# Patient Record
Sex: Male | Born: 1935 | Race: Black or African American | Hispanic: No | Marital: Married | Smoking: Never smoker
Health system: Southern US, Community
[De-identification: ages and names within clinical notes are randomized; demographics above are authoritative.]

## PROBLEM LIST (undated history)

## (undated) DIAGNOSIS — Z9889 Other specified postprocedural states: Secondary | ICD-10-CM

## (undated) DIAGNOSIS — E785 Hyperlipidemia, unspecified: Secondary | ICD-10-CM

## (undated) DIAGNOSIS — H409 Unspecified glaucoma: Secondary | ICD-10-CM

## (undated) DIAGNOSIS — N400 Enlarged prostate without lower urinary tract symptoms: Secondary | ICD-10-CM

## (undated) DIAGNOSIS — I10 Essential (primary) hypertension: Secondary | ICD-10-CM

## (undated) DIAGNOSIS — M1612 Unilateral primary osteoarthritis, left hip: Secondary | ICD-10-CM

## (undated) DIAGNOSIS — M2540 Effusion, unspecified joint: Secondary | ICD-10-CM

## (undated) DIAGNOSIS — I639 Cerebral infarction, unspecified: Secondary | ICD-10-CM

## (undated) DIAGNOSIS — M1990 Unspecified osteoarthritis, unspecified site: Secondary | ICD-10-CM

## (undated) DIAGNOSIS — M1711 Unilateral primary osteoarthritis, right knee: Secondary | ICD-10-CM

## (undated) DIAGNOSIS — R350 Frequency of micturition: Secondary | ICD-10-CM

## (undated) DIAGNOSIS — K219 Gastro-esophageal reflux disease without esophagitis: Secondary | ICD-10-CM

## (undated) DIAGNOSIS — E119 Type 2 diabetes mellitus without complications: Secondary | ICD-10-CM

## (undated) HISTORY — PX: COLONOSCOPY: SHX174

## (undated) HISTORY — PX: SHOULDER OPEN ROTATOR CUFF REPAIR: SHX2407

## (undated) HISTORY — DX: Benign prostatic hyperplasia without lower urinary tract symptoms: N40.0

## (undated) HISTORY — DX: Essential (primary) hypertension: I10

## (undated) HISTORY — PX: PATELLAR TENDON REPAIR: SHX737

## (undated) HISTORY — DX: Gastro-esophageal reflux disease without esophagitis: K21.9

## (undated) HISTORY — PX: JOINT REPLACEMENT: SHX530

## (undated) HISTORY — DX: Hyperlipidemia, unspecified: E78.5

---
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
## 2006-07-18 ENCOUNTER — Emergency Department (HOSPITAL_COMMUNITY): Admission: EM | Admit: 2006-07-18 | Discharge: 2006-07-18 | Payer: Self-pay | Admitting: Emergency Medicine

## 2007-10-22 ENCOUNTER — Emergency Department (HOSPITAL_COMMUNITY): Admission: EM | Admit: 2007-10-22 | Discharge: 2007-10-22 | Payer: Self-pay | Admitting: Emergency Medicine

## 2009-11-11 HISTORY — PX: CARDIAC CATHETERIZATION: SHX172

## 2009-12-19 ENCOUNTER — Emergency Department (HOSPITAL_COMMUNITY): Admission: EM | Admit: 2009-12-19 | Discharge: 2009-12-19 | Payer: Self-pay | Admitting: Emergency Medicine

## 2010-09-14 ENCOUNTER — Ambulatory Visit: Payer: Self-pay | Admitting: Cardiology

## 2010-09-14 ENCOUNTER — Observation Stay (HOSPITAL_COMMUNITY): Admission: EM | Admit: 2010-09-14 | Discharge: 2010-09-15 | Payer: Self-pay | Admitting: Emergency Medicine

## 2010-09-14 ENCOUNTER — Encounter: Payer: Self-pay | Admitting: Cardiology

## 2010-09-15 ENCOUNTER — Encounter: Payer: Self-pay | Admitting: Internal Medicine

## 2010-09-24 ENCOUNTER — Ambulatory Visit: Payer: Self-pay | Admitting: Internal Medicine

## 2010-09-24 DIAGNOSIS — I10 Essential (primary) hypertension: Secondary | ICD-10-CM | POA: Insufficient documentation

## 2010-09-24 DIAGNOSIS — N401 Enlarged prostate with lower urinary tract symptoms: Secondary | ICD-10-CM

## 2010-09-24 DIAGNOSIS — K219 Gastro-esophageal reflux disease without esophagitis: Secondary | ICD-10-CM | POA: Insufficient documentation

## 2010-09-24 DIAGNOSIS — N138 Other obstructive and reflux uropathy: Secondary | ICD-10-CM | POA: Insufficient documentation

## 2010-10-09 ENCOUNTER — Encounter (INDEPENDENT_AMBULATORY_CARE_PROVIDER_SITE_OTHER): Payer: Self-pay | Admitting: *Deleted

## 2010-10-10 ENCOUNTER — Encounter (INDEPENDENT_AMBULATORY_CARE_PROVIDER_SITE_OTHER): Payer: Self-pay | Admitting: *Deleted

## 2010-10-15 ENCOUNTER — Ambulatory Visit: Payer: Self-pay | Admitting: Gastroenterology

## 2010-11-19 ENCOUNTER — Encounter: Payer: Self-pay | Admitting: Gastroenterology

## 2010-11-19 ENCOUNTER — Ambulatory Visit
Admission: RE | Admit: 2010-11-19 | Discharge: 2010-11-19 | Payer: Self-pay | Source: Home / Self Care | Attending: Gastroenterology | Admitting: Gastroenterology

## 2010-11-23 ENCOUNTER — Encounter: Payer: Self-pay | Admitting: Gastroenterology

## 2010-11-26 ENCOUNTER — Ambulatory Visit
Admission: RE | Admit: 2010-11-26 | Discharge: 2010-11-26 | Payer: Self-pay | Source: Home / Self Care | Attending: Internal Medicine | Admitting: Internal Medicine

## 2010-11-26 DIAGNOSIS — R972 Elevated prostate specific antigen [PSA]: Secondary | ICD-10-CM | POA: Insufficient documentation

## 2010-12-03 ENCOUNTER — Encounter: Payer: Self-pay | Admitting: Internal Medicine

## 2010-12-11 NOTE — Letter (Signed)
Summary: Results Follow-up Letter  [facility]  [address] [place], [place] [address]   Phone: [phone]  Fax: [phone]    09/24/2010  [address] [place], [place]  [address]  Dear Mr. WEIBLE,   The following are the results of your recent test(s):  Test     Result     Prostate test   slightly elevated   _________________________________________________________  Please call for an appointment soon _________________________________________________________ _________________________________________________________ _________________________________________________________  Sincerely,  Sanda Linger MD [facility]

## 2010-12-11 NOTE — Miscellaneous (Signed)
Summary: previsit prep/rm  Clinical Lists Changes  Medications: Added new medication of MOVIPREP 100 GM  SOLR (PEG-KCL-NACL-NASULF-NA ASC-C) As per prep instructions. - Signed Rx of MOVIPREP 100 GM  SOLR (PEG-KCL-NACL-NASULF-NA ASC-C) As per prep instructions.;  #1 x 0;  Signed;  Entered by: Sherren Kerns RN;  Authorized by: Mardella Layman MD [facility];  Method used: Electronically to [facility]. [place]. [phone]*, [address], [place], [place], [place]  [address], Ph: [phone] or [phone], Fax: [phone] Observations: Added new observation of ALLERGY REV: Done (10/15/2010 14:01)    Prescriptions: MOVIPREP 100 GM  SOLR (PEG-KCL-NACL-NASULF-NA ASC-C) As per prep instructions.  #1 x 0   Entered by:   Sherren Kerns RN   Authorized by:   Mardella Layman MD [facility]   Signed by:   Sherren Kerns RN on 10/15/2010   Method used:   Electronically to        [facility]. [address] [phone]* (retail)       [address]       [place], [place]  [address]       Ph: [phone] or [phone]       Fax: [phone]   RxID:   [id number]

## 2010-12-11 NOTE — Consult Note (Signed)
Summary: [facility]   [facility]   Imported By: Roderic Ovens 09/24/2010 09:24:41  _____________________________________________________________________  External Attachment:    Type:   Image     Comment:   External Document

## 2010-12-11 NOTE — Letter (Signed)
Summary: Medication Discharge Instructions/[place]  Medication Discharge Instructions/[place]   Imported By: Sherian Rein 09/27/2010 11:33:35  _____________________________________________________________________  External Attachment:    Type:   Image     Comment:   External Document

## 2010-12-11 NOTE — Assessment & Plan Note (Signed)
Summary: New / Medicare / # / cd   Vital Signs:  Patient profile:   75 year old male Height:      65 inches Weight:      163 pounds BMI:     27.22 O2 Sat:      97 % on Room air Temp:     98.5 degrees F oral Pulse rate:   58 / minute Pulse rhythm:   regular Resp:     16 per minute BP sitting:   136 / 72  (left arm) Cuff size:   large  Vitals Entered By: Rock Nephew CMA (September 24, 2010 1:18 PM)  Nutrition Counseling: Patient's BMI is greater than 25 and therefore counseled on weight management options.  O2 Flow:  Room air CC: New to establish, Preventive Care, Hypertension Management, Is Patient Diabetic? No Pain Assessment Patient in pain? no        Primary Care Provider:  Etta Grandchild MD  CC:  New to establish, Preventive Care, Hypertension Management, and .  History of Present Illness: Here for Medicare AWV:  1.   Risk factors based on Past M, S, F history: yes 2.   Physical Activities: very active 3.   Depression/mood: mood is good 4.   Hearing: hears whispered voice at 3 feet 5.   ADL's: thorough and independent 6.   Fall Risk: none noted 7.   Home Safety: home is safe 8.   Height, weight, &visual acuity: done 9.   Counseling: yes 10.   Labs ordered based on risk factors: yes 11.           Referral Coordination: yes, done 12.           Care Plan: completed 13.            Cognitive Assessment : he answers all questions appropriately   New to me he needs a PCP.  He was admitted about one week ago for chest pain and an abn EKG but his cardiac cath was normal. He describes heartburn and belching so I think his pain is from GERD.  He has a hx. of BPH and says that he has a prostate biopsy in [place] 3 years ago.  Dyspepsia History:      He has no alarm features of dyspepsia including no history of melena, hematochezia, dysphagia, persistent vomiting, or involuntary weight loss > 5%.  There is a prior history of GERD.  The patient does not have a prior  history of documented ulcer disease.  The dominant symptom is heartburn or acid reflux.  An H-2 blocker medication is currently being taken.  He notes that the symptoms have improved with the H-2 blocker therapy.  Symptoms have not persisted after 4 weeks of H-2 blocker treatment.  No previous upper endoscopy has been done.    Hypertension History:      He denies headache, chest pain, palpitations, dyspnea with exertion, orthopnea, PND, peripheral edema, visual symptoms, neurologic problems, syncope, and side effects from treatment.  He notes no problems with any antihypertensive medication side effects.        Positive major cardiovascular risk factors include male age 8 years old or older and hypertension.  Negative major cardiovascular risk factors include no history of diabetes or hyperlipidemia, negative family history for ischemic heart disease, and non-tobacco-user status.        Further assessment for target organ damage reveals no history of ASHD, cardiac end-organ damage (CHF/LVH), stroke/TIA, peripheral vascular disease,  renal insufficiency, or hypertensive retinopathy.      Preventive Screening-Counseling & Management  Alcohol-Tobacco     Alcohol drinks/day: 0     Alcohol Counseling: not indicated; patient does not drink     Smoking Status: never     Tobacco Counseling: not indicated; no tobacco use  Caffeine-Diet-Exercise     Does Patient Exercise: yes  Hep-HIV-STD-Contraception     Hepatitis Risk: no risk noted     HIV Risk: no risk noted     STD Risk: no risk noted     Dental Visit-last 6 months yes     Dental Care Counseling: to seek dental care; no dental care within six months     TSE monthly: yes     Testicular SE Education/Counseling to perform regular STE  Safety-Violence-Falls     Seat Belt Use: yes     Helmet Use: yes     Firearms in the Home: no firearms in the home     Smoke Detectors: yes     Violence in the Home: no risk noted      Sexual History:   currently monogamous.        Drug Use:  no.        Blood Transfusions:  no.    Clinical Review Panels:  Immunizations   Last Tetanus Booster:  Tdap (09/24/2010)   Last Flu Vaccine:  given (09/11/2010)  Diabetes Management   Last Flu Vaccine:  given (09/11/2010)   Current Medications (verified): 1)  Metoprolol Tartrate 25 Mg Tabs (Metoprolol Tartrate) .... Take 1 Tablet By Mouth Two Times A Day 2)  Aspirin 81mg  .... Take 1 Tablet By Mouth Once A Day 3)  Fish Oil 1000mg  .... Take 1 Tablet By Mouth Once A Day 4)  Protonix 40 Mg Tbec (Pantoprazole Sodium) .... Take 1 Tablet By Mouth Every Morning 5)  Glucosamine Hcl .... Take 1 Tablet By Mouth Once A Day 6)  Multivitamins .... Take 1 Tablet By Mouth Once A Day 7)  Tylenol 325 .Marland Kitchen.. 1-2 Every 6hrs As Needed For Pain 8)  Xalatan 0.005 % Soln (Latanoprost) .... One Drop in Both Eyes At Bedtime  Allergies (verified): No Known Drug Allergies  Past History:  Past Medical History: GERD Hypertension BPH  Past Surgical History: Rotator cuff repair  Family History: Family History Kidney disease  Social History: Retired Married Never Smoked Alcohol use-no Drug use-no Regular exercise-yes Smoking Status:  never Hepatitis Risk:  no risk noted HIV Risk:  no risk noted STD Risk:  no risk noted Dental Care w/in 6 mos.:  yes Seat Belt Use:  yes Sexual History:  currently monogamous Blood Transfusions:  no Drug Use:  no Does Patient Exercise:  yes  Review of Systems       The patient complains of severe indigestion/heartburn.  The patient denies anorexia, fever, weight loss, weight gain, dyspnea on exertion, peripheral edema, prolonged cough, headaches, hemoptysis, abdominal pain, melena, hematochezia, hematuria, suspicious skin lesions, difficulty walking, depression, abnormal bleeding, enlarged lymph nodes, and testicular masses.   GU:  Denies discharge, dysuria, erectile dysfunction, hematuria, incontinence, nocturia,  urinary frequency, and urinary hesitancy.  Physical Exam  General:  alert, well-developed, well-nourished, well-hydrated, appropriate dress, normal appearance, healthy-appearing, and cooperative to examination.   Head:  normocephalic, atraumatic, no abnormalities observed, and no abnormalities palpated.   Eyes:  vision grossly intact, pupils equal, pupils round, and pupils reactive to light.   Mouth:  Oral mucosa and oropharynx without lesions or exudates.  Teeth in good repair. Neck:  supple, full ROM, no masses, no thyromegaly, no thyroid nodules or tenderness, no JVD, normal carotid upstroke, no carotid bruits, and no cervical lymphadenopathy.   Chest Wall:  No deformities, masses, tenderness or gynecomastia noted. Breasts:  No masses or gynecomastia noted Lungs:  normal respiratory effort, no intercostal retractions, no accessory muscle use, normal breath sounds, no dullness, no fremitus, no crackles, and no wheezes.   Heart:  normal rate, regular rhythm, no murmur, no gallop, no rub, and no JVD.   Abdomen:  soft, non-tender, normal bowel sounds, no distention, no masses, no guarding, no rigidity, no rebound tenderness, no abdominal hernia, no inguinal hernia, no hepatomegaly, and no splenomegaly.   Rectal:  No external abnormalities noted. Normal sphincter tone. No rectal masses or tenderness. Genitalia:  uncircumcised, no hydrocele, no varicocele, no scrotal masses, no testicular masses or atrophy, no cutaneous lesions, and no urethral discharge.   Prostate:  no nodules, no asymmetry, no induration, and 3+ enlarged.   Msk:  normal ROM, no joint tenderness, no joint swelling, no joint warmth, no redness over joints, no joint deformities, no joint instability, no crepitation, and no muscle atrophy.   Pulses:  R and L carotid,radial,femoral,dorsalis pedis and posterior tibial pulses are full and equal bilaterally Extremities:  No clubbing, cyanosis, edema, or deformity noted with normal full  range of motion of all joints.   Neurologic:  No cranial nerve deficits noted. Station and gait are normal. Plantar reflexes are down-going bilaterally. DTRs are symmetrical throughout. Sensory, motor and coordinative functions appear intact. Skin:  turgor normal, color normal, no rashes, no suspicious lesions, no ecchymoses, no petechiae, no purpura, no ulcerations, and no edema.   Cervical Nodes:  no anterior cervical adenopathy and no posterior cervical adenopathy.   Axillary Nodes:  no R axillary adenopathy and no L axillary adenopathy.   Inguinal Nodes:  no R inguinal adenopathy and no L inguinal adenopathy.   Psych:  Cognition and judgment appear intact. Alert and cooperative with normal attention span and concentration. No apparent delusions, illusions, hallucinations   Impression & Recommendations:  Problem # 1:  HYPERTENSION (ICD-401.9) Assessment Improved  His updated medication list for this problem includes:    Metoprolol Tartrate 25 Mg Tabs (Metoprolol tartrate) .Marland Kitchen... Take 1 tablet by mouth two times a day  BP today: 136/72  10 Yr Risk Heart Disease: Not enough information  Problem # 2:  HYPERTROPHY PROSTATE W/UR OBST & OTH LUTS (ICD-600.01) Assessment: New  Orders: Venipuncture (60454) Prostate / PSA (Medicare) (G0103) DRE (G0102)  Problem # 3:  ROUTINE GENERAL MEDICAL EXAM@HEALTH  CARE FACL (ICD-V70.0) Assessment: New  Orders: Venipuncture (09811) TLB-PSA (Prostate Specific Antigen) (84153-PSA) Prostate / PSA (Medicare) (B1478) DRE (G0102) Hemoccult Guaiac-1 spec.(in office) (29562) Gastroenterology Referral (GI) Medicare -1st Annual Wellness Visit [phone])  Td Booster: Tdap (09/24/2010)   Flu Vax: given (09/11/2010)    Discussed using sunscreen, use of alcohol, drug use, self testicular exam, routine dental care, routine eye care, routine physical exam, seat belts, multiple vitamins, osteoporosis prevention, adequate calcium intake in diet, and recommendations  for immunizations.  Discussed exercise and checking cholesterol.  Discussed gun safety, safe sex, and contraception. Also recommend checking PSA.  Complete Medication List: 1)  Metoprolol Tartrate 25 Mg Tabs (Metoprolol tartrate) .... Take 1 tablet by mouth two times a day 2)  Aspirin 81mg   .... Take 1 tablet by mouth once a day 3)  Fish Oil 1000mg   .... Take 1 tablet by mouth  once a day 4)  Protonix 40 Mg Tbec (Pantoprazole sodium) .... Take 1 tablet by mouth every morning 5)  Glucosamine Hcl  .... Take 1 tablet by mouth once a day 6)  Multivitamins  .... Take 1 tablet by mouth once a day 7)  Tylenol 325  .Marland Kitchen.. 1-2 every 6hrs as needed for pain 8)  Xalatan 0.005 % Soln (Latanoprost) .... One drop in both eyes at bedtime  Other Orders: Tdap => 23yrs IM ([address]) Admin 1st Vaccine (60454)  Hypertension Assessment/Plan:      The patient's hypertensive risk group is category B: At least one risk factor (excluding diabetes) with no target organ damage.  Today's blood pressure is 136/72.  His blood pressure goal is < 140/90.  Colorectal Screening:  Current Recommendations:    Hemoccult: NEG X 1 today  PSA Screening:    Reviewed PSA screening recommendations: PSA ordered  Immunization & Chemoprophylaxis:    Tetanus vaccine: Tdap  (09/24/2010)    Influenza vaccine: given  (09/11/2010)  Patient Instructions: 1)  Please schedule a follow-up appointment in 2 months. 2)  Schedule a colonoscopy/sigmoidoscopy to help detect colon cancer. 3)  Check your Blood Pressure regularly. If it is above 140/90: you should make an appointment.   Orders Added: 1)  Venipuncture [36415] 2)  Tdap => 71yrs IM [90715] 3)  Admin 1st Vaccine [90471] 4)  Venipuncture [09811] 5)  TLB-PSA (Prostate Specific Antigen) [91478-GNF] 6)  Prostate / PSA (Medicare) [G0103] 7)  DRE [G0102] 8)  Hemoccult Guaiac-1 spec.(in office) [82270] 9)  Gastroenterology Referral [GI] 10)  Medicare -1st Annual Wellness Visit  [G0438] 11)  New Patient Level III [62130]   Immunizations Administered:  Tetanus Vaccine:    Vaccine Type: Tdap    Site: right deltoid    Mfr: GlaxoSmithKline    Dose: 0.5 ml    Route: IM    Given by: Rock Nephew CMA    Exp. Date: 08/30/2012    Lot #: [id number]    VIS given: 09/28/08 version given September 24, 2010.   Immunizations Administered:  Tetanus Vaccine:    Vaccine Type: Tdap    Site: right deltoid    Mfr: GlaxoSmithKline    Dose: 0.5 ml    Route: IM    Given by: Rock Nephew CMA    Exp. Date: 08/30/2012    Lot #: [id number]    VIS given: 09/28/08 version given September 24, 2010.  Preventive Care Screening  Last Flu Shot:    Date:  09/11/2010    Results:  given

## 2010-12-11 NOTE — Letter (Signed)
Summary: [facility] Instructions  [facility]  [address] [place], [place] [address]   Phone: [phone]  Fax: [phone]       Jonathan Daniels    Jun 20, 1936    MRN: [id number]        Procedure Day /Date:  Monday 11/19/2009     Arrival Time: 9:00 am      Procedure Time: 10:00 am     Location of Procedure:                    _ x_  [facility] (4th Floor)                         PREPARATION FOR COLONOSCOPY WITH MOVIPREP   Starting 5 days prior to your procedure Wednesday 1/4 do not eat nuts, seeds, popcorn, corn, beans, peas,  salads, or any raw vegetables.  Do not take any fiber supplements (e.g. Metamucil, Citrucel, and Benefiber).  THE DAY BEFORE YOUR PROCEDURE         DATE: Sunday 1/8  1.  Drink clear liquids the entire day-NO SOLID FOOD  2.  Do not drink anything colored red or purple.  Avoid juices with pulp.  No orange juice.  3.  Drink at least 64 oz. (8 glasses) of fluid/clear liquids during the day to prevent dehydration and help the prep work efficiently.  CLEAR LIQUIDS INCLUDE: Water Jello Ice Popsicles Tea (sugar ok, no milk/cream) Powdered fruit flavored drinks Coffee (sugar ok, no milk/cream) Gatorade Juice: apple, white grape, white cranberry  Lemonade Clear bullion, consomm, broth Carbonated beverages (any kind) Strained chicken noodle soup Hard Candy                             4.  In the morning, mix first dose of MoviPrep solution:    Empty 1 Pouch A and 1 Pouch B into the disposable container    Add lukewarm drinking water to the top line of the container. Mix to dissolve    Refrigerate (mixed solution should be used within 24 hrs)  5.  Begin drinking the prep at 5:00 p.m. The MoviPrep container is divided by 4 marks.   Every 15 minutes drink the solution down to the next mark (approximately 8 oz) until the full liter is complete.   6.  Follow completed prep with 16 oz of clear liquid of your choice (Nothing  red or purple).  Continue to drink clear liquids until bedtime.  7.  Before going to bed, mix second dose of MoviPrep solution:    Empty 1 Pouch A and 1 Pouch B into the disposable container    Add lukewarm drinking water to the top line of the container. Mix to dissolve    Refrigerate  THE DAY OF YOUR PROCEDURE      DATE: Monday 1/9  Beginning at 5:00 a.m. (5 hours before procedure):         1. Every 15 minutes, drink the solution down to the next mark (approx 8 oz) until the full liter is complete.  2. Follow completed prep with 16 oz. of clear liquid of your choice.    3. You may drink clear liquids until 8:00 am (2 HOURS BEFORE PROCEDURE).   MEDICATION INSTRUCTIONS  Unless otherwise instructed, you should take regular prescription medications with a small sip of water   as early as possible the morning  of your procedure.           OTHER INSTRUCTIONS  You will need a responsible adult at least 73 years of age to accompany you and drive you home.   This person must remain in the waiting room during your procedure.  Wear loose fitting clothing that is easily removed.  Leave jewelry and other valuables at home.  However, you may wish to bring a book to read or  an iPod/MP3 player to listen to music as you wait for your procedure to start.  Remove all body piercing jewelry and leave at home.  Total time from sign-in until discharge is approximately 2-3 hours.  You should go home directly after your procedure and rest.  You can resume normal activities the  day after your procedure.  The day of your procedure you should not:   Drive   Make legal decisions   Operate machinery   Drink alcohol   Return to work  You will receive specific instructions about eating, activities and medications before you leave.    The above instructions have been reviewed and explained to me by  Sherren Kerns RN  October 15, 2010 2:25 PM     I fully understand and can  verbalize these instructions _____________________________ Date _________

## 2010-12-11 NOTE — Letter (Signed)
Summary: Pre Visit Letter Revised  [facility]  [address] [place], [place] [address]   Phone: [phone]  Fax: [phone]        10/09/2010 MRN: [id number] Jonathan Daniels [address] [place], [place]  [address]             Procedure Date:  11/19/2010  Welcome to the Gastroenterology Division at [facility].    You are scheduled to see a nurse for your pre-procedure visit on 10/11/2010 at 8:00AM on the 3rd floor at [facility], [address]  We ask that you try to arrive at our office 15 minutes prior to your appointment time to allow for check-in.  Please take a minute to review the attached form.  If you answer "Yes" to one or more of the questions on the first page, we ask that you call the person listed at your earliest opportunity.  If you answer "No" to all of the questions, please complete the rest of the form and bring it to your appointment.    Your nurse visit will consist of discussing your medical and surgical history, your immediate family medical history, and your medications.   If you are unable to list all of your medications on the form, please bring the medication bottles to your appointment and we will list them.  We will need to be aware of both prescribed and over the counter drugs.  We will need to know exact dosage information as well.    Please be prepared to read and sign documents such as consent forms, a financial agreement, and acknowledgement forms.  If necessary, and with your consent, a friend or relative is welcome to sit-in on the nurse visit with you.  Please bring your insurance card so that we may make a copy of it.  If your insurance requires a referral to see a specialist, please bring your referral form from your primary care physician.  No co-pay is required for this nurse visit.     If you cannot keep your appointment, please call [phone] to cancel or reschedule prior to your appointment date.   This allows [place] the opportunity to schedule an appointment for another patient in need of care.    Thank you for choosing [facility] for your medical needs.  We appreciate the opportunity to care for you.  Please visit [place] at our website  to learn more about our practice.  Sincerely, The Gastroenterology Division

## 2010-12-13 NOTE — Assessment & Plan Note (Signed)
Summary: 2 MOS F/U #/CD   Vital Signs:  Patient profile:   75 year old male Height:      65 inches Weight:      164.25 pounds BMI:     27.43 O2 Sat:      94 % on Room air Temp:     97.7 degrees F oral Pulse rate:   64 / minute Pulse rhythm:   regular Resp:     16 per minute BP sitting:   148 / 88  (left arm) Cuff size:   large  Vitals Entered By: Rock Nephew CMA (November 26, 2010 9:50 AM)  O2 Flow:  Room air CC: follow-up visit Is Patient Diabetic? No   Primary Care Provider:  Etta Grandchild MD  CC:  follow-up visit.  History of Present Illness:  Hypertension Follow-Up      This is a 75 year old man who presents for Hypertension follow-up.  The patient denies lightheadedness, urinary frequency, headaches, edema, impotence, rash, and fatigue.  The patient denies the following associated symptoms: chest pain, chest pressure, exercise intolerance, dyspnea, palpitations, syncope, leg edema, and pedal edema.  Compliance with medications (by patient report) has been poor.  The patient reports that dietary compliance has been poor.  The patient reports no exercise.    Preventive Screening-Counseling & Management  Alcohol-Tobacco     Alcohol drinks/day: 0     Alcohol Counseling: not indicated; patient does not drink     Smoking Status: never     Tobacco Counseling: not indicated; no tobacco use  Hep-HIV-STD-Contraception     Hepatitis Risk: no risk noted     HIV Risk: no risk noted     STD Risk: no risk noted     Dental Visit-last 6 months yes     Dental Care Counseling: to seek dental care; no dental care within six months     TSE monthly: yes     Testicular SE Education/Counseling to perform regular STE      Sexual History:  currently monogamous.        Drug Use:  no.        Blood Transfusions:  no.    Medications Prior to Update: 1)  Metoprolol Tartrate 25 Mg Tabs (Metoprolol Tartrate) .... Take 1 Tablet By Mouth Two Times A Day 2)  Aspirin 81mg  .... Take 1  Tablet By Mouth Once A Day 3)  Fish Oil 1000mg  .... Take 1 Tablet By Mouth Once A Day 4)  Protonix 40 Mg Tbec (Pantoprazole Sodium) .... Take 1 Tablet By Mouth Every Morning 5)  Glucosamine Hcl .... Take 1 Tablet By Mouth Once A Day 6)  Multivitamins .... Take 1 Tablet By Mouth Once A Day 7)  Tylenol 325 .Marland Kitchen.. 1-2 Every 6hrs As Needed For Pain 8)  Xalatan 0.005 % Soln (Latanoprost) .... One Drop in Both Eyes At Bedtime  Current Medications (verified): 1)  Metoprolol Tartrate 25 Mg Tabs (Metoprolol Tartrate) .... Take 1 Tablet By Mouth Two Times A Day 2)  Aspirin 81mg  .... Take 1 Tablet By Mouth Once A Day 3)  Fish Oil 1000mg  .... Take 1 Tablet By Mouth Once A Day 4)  Protonix 40 Mg Tbec (Pantoprazole Sodium) .... Take 1 Tablet By Mouth Every Morning 5)  Glucosamine Hcl .... Take 1 Tablet By Mouth Once A Day 6)  Multivitamins .... Take 1 Tablet By Mouth Once A Day 7)  Tylenol 325 .Marland Kitchen.. 1-2 Every 6hrs As Needed For Pain 8)  Xalatan 0.005 % Soln (Latanoprost) .... One Drop in Both Eyes At Bedtime 9)  Benicar Hct 20-12.5 Mg Tabs (Olmesartan Medoxomil-Hctz) .... One By Mouth Once Daily  Allergies (verified): No Known Drug Allergies  Past History:  Past Medical History: Last updated: 09/24/2010 GERD Hypertension BPH  Past Surgical History: Last updated: 09/24/2010 Rotator cuff repair  Family History: Last updated: 09/24/2010 Family History Kidney disease  Social History: Last updated: 09/24/2010 Retired Married Never Smoked Alcohol use-no Drug use-no Regular exercise-yes  Risk Factors: Alcohol Use: 0 (11/26/2010) Exercise: yes (09/24/2010)  Risk Factors: Smoking Status: never (11/26/2010)  Family History: Reviewed history from 09/24/2010 and no changes required. Family History Kidney disease  Social History: Reviewed history from 09/24/2010 and no changes required. Retired Married Never Smoked Alcohol use-no Drug use-no Regular exercise-yes  Review of  Systems  The patient denies anorexia, fever, chest pain, peripheral edema, prolonged cough, headaches, hemoptysis, abdominal pain, hematuria, suspicious skin lesions, abnormal bleeding, and testicular masses.   GU:  Denies discharge, dysuria, hematuria, incontinence, nocturia, urinary frequency, and urinary hesitancy.  Physical Exam  General:  alert, well-developed, well-nourished, well-hydrated, appropriate dress, normal appearance, healthy-appearing, and cooperative to examination.   Mouth:  Oral mucosa and oropharynx without lesions or exudates.  Teeth in good repair. Neck:  supple, full ROM, no masses, no thyromegaly, no thyroid nodules or tenderness, no JVD, normal carotid upstroke, no carotid bruits, and no cervical lymphadenopathy.   Lungs:  normal respiratory effort, no intercostal retractions, no accessory muscle use, normal breath sounds, no dullness, no fremitus, no crackles, and no wheezes.   Heart:  normal rate, regular rhythm, no murmur, no gallop, no rub, and no JVD.   Abdomen:  soft, non-tender, normal bowel sounds, no distention, no masses, no guarding, no rigidity, no rebound tenderness, no abdominal hernia, no inguinal hernia, no hepatomegaly, and no splenomegaly.   Msk:  normal ROM, no joint tenderness, no joint swelling, no joint warmth, no redness over joints, no joint deformities, no joint instability, no crepitation, and no muscle atrophy.   Extremities:  No clubbing, cyanosis, edema, or deformity noted with normal full range of motion of all joints.   Neurologic:  No cranial nerve deficits noted. Station and gait are normal. Plantar reflexes are down-going bilaterally. DTRs are symmetrical throughout. Sensory, motor and coordinative functions appear intact. Skin:  turgor normal, color normal, no rashes, no suspicious lesions, no ecchymoses, no petechiae, no purpura, no ulcerations, and no edema.   Cervical Nodes:  no anterior cervical adenopathy and no posterior cervical  adenopathy.   Psych:  Cognition and judgment appear intact. Alert and cooperative with normal attention span and concentration. No apparent delusions, illusions, hallucinations   Impression & Recommendations:  Problem # 1:  PSA, INCREASED (ICD-790.93) Assessment New  Orders: Urology Referral (Urology)  Problem # 2:  HYPERTENSION (ICD-401.9) Assessment: Deteriorated  His updated medication list for this problem includes:    Metoprolol Tartrate 25 Mg Tabs (Metoprolol tartrate) .Marland Kitchen... Take 1 tablet by mouth two times a day    Benicar Hct 20-12.5 Mg Tabs (Olmesartan medoxomil-hctz) ..... One by mouth once daily  BP today: 148/88 Prior BP: 136/72 (09/24/2010)  Prior 10 Yr Risk Heart Disease: Not enough information (09/24/2010)  Problem # 3:  HYPERTROPHY PROSTATE W/UR OBST & OTH LUTS (ICD-600.01) Assessment: Unchanged  Complete Medication List: 1)  Metoprolol Tartrate 25 Mg Tabs (Metoprolol tartrate) .... Take 1 tablet by mouth two times a day 2)  Aspirin 81mg   .... Take 1 tablet by  mouth once a day 3)  Fish Oil 1000mg   .... Take 1 tablet by mouth once a day 4)  Protonix 40 Mg Tbec (Pantoprazole sodium) .... Take 1 tablet by mouth every morning 5)  Glucosamine Hcl  .... Take 1 tablet by mouth once a day 6)  Multivitamins  .... Take 1 tablet by mouth once a day 7)  Tylenol 325  .Marland Kitchen.. 1-2 every 6hrs as needed for pain 8)  Xalatan 0.005 % Soln (Latanoprost) .... One drop in both eyes at bedtime 9)  Benicar Hct 20-12.5 Mg Tabs (Olmesartan medoxomil-hctz) .... One by mouth once daily  Patient Instructions: 1)  Please schedule a follow-up appointment in 1 month. 2)  Check your Blood Pressure regularly. If it is above 130/80: you should make an appointment. Prescriptions: BENICAR HCT 20-12.5 MG TABS (OLMESARTAN MEDOXOMIL-HCTZ) One by mouth once daily  #28 x 0   Entered and Authorized by:   Etta Grandchild MD   Signed by:   Etta Grandchild MD on 11/26/2010   Method used:   Samples  Given   RxID:   [id number] METOPROLOL TARTRATE 25 MG TABS (METOPROLOL TARTRATE) Take 1 tablet by mouth two times a day  #60 x 11   Entered and Authorized by:   Etta Grandchild MD   Signed by:   Etta Grandchild MD on 11/26/2010   Method used:   Electronically to        [facility] [address] [phone]* (retail)       [address]       [place], [place]  [address]       Ph: [phone] or [phone]       Fax: [phone]   RxID:   [id number]    Orders Added: 1)  Urology Referral [Urology] 2)  Est. Patient Level IV [75643]

## 2010-12-13 NOTE — Procedures (Signed)
Summary: Colonoscopy  Patient: Jonathan Daniels Note: All result statuses are Final unless otherwise noted.  Tests: (1) Colonoscopy (COL)   COL Colonoscopy           DONE     [facility]     [address]     [place], [place]  [address]           COLONOSCOPY PROCEDURE REPORT           PATIENT:  Jonathan Daniels, Jonathan Daniels  MR#:  [id number]     BIRTHDATE:  08/17/1936, 75 yrs. old  GENDER:  male     ENDOSCOPIST:  Vania Rea. Jarold Motto, MD, [facility]     REF. BY:  Etta Grandchild, M.D.     PROCEDURE DATE:  11/19/2010     PROCEDURE:  Colonoscopy with biopsy     ASA CLASS:  Class II     INDICATIONS:  Routine Risk Screening     MEDICATIONS:   Fentanyl 50 mcg IV, Versed 5 mg IV           DESCRIPTION OF PROCEDURE:   After the risks benefits and     alternatives of the procedure were thoroughly explained, informed     consent was obtained.  Digital rectal exam was performed and     revealed no abnormalities.   The LB [id number] endoscope     was introduced through the anus and advanced to the cecum, which     was identified by both the appendix and ileocecal valve, without     limitations.  The quality of the prep was good, using MoviPrep.     The instrument was then slowly withdrawn as the colon was fully     examined.     <<PROCEDUREIMAGES>>           FINDINGS:  A diminutive polyp was found in the sigmoid colon.     FLAT POLYP COLD SNARE EXCISED.  This was otherwise a normal     examination of the colon.   Retroflexed views in the rectum     revealed no abnormalities.    The scope was then withdrawn from     the patient and the procedure completed.           COMPLICATIONS:  None     ENDOSCOPIC IMPRESSION:     1) Diminutive polyp in the sigmoid colon     2) Otherwise normal examination     R/O ADENOMA.     RECOMMENDATIONS:     1) Await biopsy results     2) Repeat colonoscopy in 5 years if polyp adenomatous; otherwise     10 years     REPEAT EXAM:  No        ______________________________     Vania Rea. Jarold Motto, MD, [facility]           CC:           n.     eSIGNED:   Vania Rea. Tramon Crescenzo at 11/19/2010 10:32 AM           Irving Shows, [id number]  Note: An exclamation mark (!) indicates a result that was not dispersed into the flowsheet. Document Creation Date: 11/19/2010 10:32 AM _______________________________________________________________________  (1) Order result status: Final Collection or observation date-time: 11/19/2010 10:26 Requested date-time:  Receipt date-time:  Reported date-time:  Referring Physician:   Ordering Physician: Sheryn Bison [phone]) Specimen Source:  Source: Launa Grill Order Number: [id number] Lab site:   Appended  Document: Colonoscopy     Procedures Next Due Date:    Colonoscopy: 11/2020

## 2010-12-13 NOTE — Letter (Signed)
Summary: Patient Notice- Polyp Results  [facility]  [address] [place], [place] [address]   Phone: [phone]  Fax: [phone]        November 23, 2010 MRN: [id number]    Jonathan Daniels [address] [place], [place]  [address]    Dear Mr. PROM,  I am pleased to inform you that the colon polyp(s) removed during your recent colonoscopy was (were) found to be benign (no cancer detected) upon pathologic examination.  I recommend you have a repeat colonoscopy examination in 10_ years to look for recurrent polyps, as having colon polyps increases your risk for having recurrent polyps or even colon cancer in the future.  Should you develop new or worsening symptoms of abdominal pain, bowel habit changes or bleeding from the rectum or bowels, please schedule an evaluation with either your primary care physician or with me.  Additional information/recommendations:  _xx_ No further action with gastroenterology is needed at this time. Please      follow-up with your primary care physician for your other healthcare      needs.  __ Please call [phone] to schedule a return visit to review your      situation.  __ Please keep your follow-up visit as already scheduled.  __ Continue treatment plan as outlined the day of your exam.  Please call us if you are having persistent problems or have questions about your condition that have not been fully answered at this time.  Sincerely,  Mardella Layman MD [facility]  This letter has been electronically signed by your physician.  Appended Document: Patient Notice- Polyp Results Letter mailed

## 2010-12-26 ENCOUNTER — Ambulatory Visit: Payer: Self-pay | Admitting: Internal Medicine

## 2010-12-27 NOTE — Consult Note (Signed)
Summary: Urology/[facility]  Urology/[facility]   Imported By: Lester Hawley 12/21/2010 16:01:58  _____________________________________________________________________  External Attachment:    Type:   Image     Comment:   External Document

## 2011-01-02 ENCOUNTER — Encounter: Payer: Self-pay | Admitting: Internal Medicine

## 2011-01-02 ENCOUNTER — Ambulatory Visit (INDEPENDENT_AMBULATORY_CARE_PROVIDER_SITE_OTHER): Payer: Medicare Other | Admitting: Internal Medicine

## 2011-01-02 DIAGNOSIS — R972 Elevated prostate specific antigen [PSA]: Secondary | ICD-10-CM

## 2011-01-02 DIAGNOSIS — I10 Essential (primary) hypertension: Secondary | ICD-10-CM

## 2011-01-02 DIAGNOSIS — K219 Gastro-esophageal reflux disease without esophagitis: Secondary | ICD-10-CM

## 2011-01-08 NOTE — Assessment & Plan Note (Signed)
Summary: 1 MO FU  / STC /NWS   Vital Signs:  Patient profile:   75 year old male Height:      65 inches Weight:      163.25 pounds BMI:     27.26 O2 Sat:      96 % on Room air Temp:     98.0 degrees F oral Pulse rate:   66 / minute Pulse rhythm:   regular Resp:     16 per minute BP sitting:   120 / 78  (left arm) Cuff size:   large  Vitals Entered By: Rock Nephew CMA (January 02, 2011 9:46 AM)  Nutrition Counseling: Patient's BMI is greater than 25 and therefore counseled on weight management options.  O2 Flow:  Room air CC: follow-up visit, Is Patient Diabetic? No Pain Assessment Patient in pain? no        Primary Care Provider:  Etta Grandchild MD  CC:  follow-up visit and .  History of Present Illness:  Hypertension Follow-Up      This is a 75 year old man who presents for Hypertension follow-up.  The patient denies lightheadedness, urinary frequency, headaches, edema, impotence, rash, and fatigue.  The patient denies the following associated symptoms: chest pain, chest pressure, exercise intolerance, dyspnea, palpitations, syncope, leg edema, and pedal edema.  Compliance with medications (by patient report) has been poor.  The patient reports that dietary compliance has been good.  The patient reports no exercise.  Adjunctive measures currently used by the patient include salt restriction and relaxation.    Dyspepsia History:      He has no alarm features of dyspepsia including no history of melena, hematochezia, dysphagia, persistent vomiting, or involuntary weight loss > 5%.  There is a prior history of GERD.  The patient does not have a prior history of documented ulcer disease.  The dominant symptom is heartburn or acid reflux.  An H-2 blocker medication is currently being taken.  He notes that the symptoms have improved with the H-2 blocker therapy.  Symptoms have not persisted after 4 weeks of H-2 blocker treatment.    Preventive Screening-Counseling &  Management  Alcohol-Tobacco     Alcohol drinks/day: 0     Alcohol Counseling: not indicated; patient does not drink     Smoking Status: never     Tobacco Counseling: not indicated; no tobacco use  Hep-HIV-STD-Contraception     Hepatitis Risk: no risk noted     HIV Risk: no risk noted     STD Risk: no risk noted     Dental Visit-last 6 months yes     Dental Care Counseling: to seek dental care; no dental care within six months     TSE monthly: yes     Testicular SE Education/Counseling to perform regular STE      Sexual History:  currently monogamous.        Drug Use:  no.        Blood Transfusions:  no.    Clinical Review Panels:  Prevention   Last Colonoscopy:  DONE (11/19/2010)   Last PSA:  6.13 (09/24/2010)  Immunizations   Last Tetanus Booster:  Tdap (09/24/2010)   Last Flu Vaccine:  given (09/11/2010)  Diabetes Management   Last Flu Vaccine:  given (09/11/2010)   Medications Prior to Update: 1)  Metoprolol Tartrate 25 Mg Tabs (Metoprolol Tartrate) .... Take 1 Tablet By Mouth Two Times A Day 2)  Aspirin 81mg  .... Take 1 Tablet By  Mouth Once A Day 3)  Fish Oil 1000mg  .... Take 1 Tablet By Mouth Once A Day 4)  Protonix 40 Mg Tbec (Pantoprazole Sodium) .... Take 1 Tablet By Mouth Every Morning 5)  Glucosamine Hcl .... Take 1 Tablet By Mouth Once A Day 6)  Multivitamins .... Take 1 Tablet By Mouth Once A Day 7)  Tylenol 325 .Marland Kitchen.. 1-2 Every 6hrs As Needed For Pain 8)  Xalatan 0.005 % Soln (Latanoprost) .... One Drop in Both Eyes At Bedtime 9)  Benicar Hct 20-12.5 Mg Tabs (Olmesartan Medoxomil-Hctz) .... One By Mouth Once Daily  Current Medications (verified): 1)  Metoprolol Tartrate 25 Mg Tabs (Metoprolol Tartrate) .... Take 1 Tablet By Mouth Two Times A Day 2)  Aspirin 81mg  .... Take 1 Tablet By Mouth Once A Day 3)  Fish Oil 1000mg  .... Take 1 Tablet By Mouth Once A Day 4)  Protonix 40 Mg Tbec (Pantoprazole Sodium) .... Take 1 Tablet By Mouth Every Morning 5)   Glucosamine Hcl .... Take 1 Tablet By Mouth Once A Day 6)  Multivitamins .... Take 1 Tablet By Mouth Once A Day 7)  Tylenol 325 .Marland Kitchen.. 1-2 Every 6hrs As Needed For Pain 8)  Xalatan 0.005 % Soln (Latanoprost) .... One Drop in Both Eyes At Bedtime  Allergies (verified): No Known Drug Allergies  Past History:  Past Medical History: Last updated: 09/24/2010 GERD Hypertension BPH  Past Surgical History: Last updated: 09/24/2010 Rotator cuff repair  Family History: Last updated: 09/24/2010 Family History Kidney disease  Social History: Last updated: 09/24/2010 Retired Married Never Smoked Alcohol use-no Drug use-no Regular exercise-yes  Risk Factors: Alcohol Use: 0 (01/02/2011) Exercise: yes (09/24/2010)  Risk Factors: Smoking Status: never (01/02/2011)  Family History: Reviewed history from 09/24/2010 and no changes required. Family History Kidney disease  Social History: Reviewed history from 09/24/2010 and no changes required. Retired Married Never Smoked Alcohol use-no Drug use-no Regular exercise-yes  Review of Systems       The patient complains of weight gain.  The patient denies anorexia, fever, weight loss, chest pain, syncope, dyspnea on exertion, peripheral edema, prolonged cough, headaches, hemoptysis, abdominal pain, melena, hematuria, suspicious skin lesions, difficulty walking, depression, enlarged lymph nodes, and angioedema.    Physical Exam  General:  alert, well-developed, well-nourished, well-hydrated, appropriate dress, normal appearance, healthy-appearing, and cooperative to examination.   Head:  normocephalic, atraumatic, no abnormalities observed, and no abnormalities palpated.   Eyes:  vision grossly intact, pupils equal, pupils round, and pupils reactive to light.   Mouth:  Oral mucosa and oropharynx without lesions or exudates.  Teeth in good repair. Neck:  supple, full ROM, no masses, no thyromegaly, no thyroid nodules or tenderness,  no JVD, normal carotid upstroke, no carotid bruits, and no cervical lymphadenopathy.   Lungs:  normal respiratory effort, no intercostal retractions, no accessory muscle use, normal breath sounds, no dullness, no fremitus, no crackles, and no wheezes.   Heart:  normal rate, regular rhythm, no murmur, no gallop, no rub, and no JVD.   Abdomen:  soft, non-tender, normal bowel sounds, no distention, no masses, no guarding, no rigidity, no rebound tenderness, no abdominal hernia, no inguinal hernia, no hepatomegaly, and no splenomegaly.   Msk:  normal ROM, no joint tenderness, no joint swelling, no joint warmth, no redness over joints, no joint deformities, no joint instability, no crepitation, and no muscle atrophy.   Pulses:  R and L carotid,radial,femoral,dorsalis pedis and posterior tibial pulses are full and equal bilaterally Extremities:  No  clubbing, cyanosis, edema, or deformity noted with normal full range of motion of all joints.   Neurologic:  No cranial nerve deficits noted. Station and gait are normal. Plantar reflexes are down-going bilaterally. DTRs are symmetrical throughout. Sensory, motor and coordinative functions appear intact. Skin:  turgor normal, color normal, no rashes, no suspicious lesions, no ecchymoses, no petechiae, no purpura, no ulcerations, and no edema.   Cervical Nodes:  no anterior cervical adenopathy and no posterior cervical adenopathy.   Axillary Nodes:  no R axillary adenopathy and no L axillary adenopathy.   Psych:  Cognition and judgment appear intact. Alert and cooperative with normal attention span and concentration. No apparent delusions, illusions, hallucinations   Impression & Recommendations:  Problem # 1:  HYPERTENSION (ICD-401.9) Assessment Improved  The following medications were removed from the medication list:    Benicar Hct 20-12.5 Mg Tabs (Olmesartan medoxomil-hctz) ..... One by mouth once daily His updated medication list for this problem  includes:    Metoprolol Tartrate 25 Mg Tabs (Metoprolol tartrate) .Marland Kitchen... Take 1 tablet by mouth two times a day  Problem # 2:  PSA, INCREASED (ICD-790.93) Assessment: Unchanged he is being followed at [facility] for this  Problem # 3:  GERD (ICD-530.81) Assessment: Improved  His updated medication list for this problem includes:    Protonix 40 Mg Tbec (Pantoprazole sodium) .Marland Kitchen... Take 1 tablet by mouth every morning  Complete Medication List: 1)  Metoprolol Tartrate 25 Mg Tabs (Metoprolol tartrate) .... Take 1 tablet by mouth two times a day 2)  Aspirin 81mg   .... Take 1 tablet by mouth once a day 3)  Fish Oil 1000mg   .... Take 1 tablet by mouth once a day 4)  Protonix 40 Mg Tbec (Pantoprazole sodium) .... Take 1 tablet by mouth every morning 5)  Glucosamine Hcl  .... Take 1 tablet by mouth once a day 6)  Multivitamins  .... Take 1 tablet by mouth once a day 7)  Tylenol 325  .Marland Kitchen.. 1-2 every 6hrs as needed for pain 8)  Xalatan 0.005 % Soln (Latanoprost) .... One drop in both eyes at bedtime   Patient Instructions: 1)  Please schedule a follow-up appointment in 4 months. 2)  It is important that you exercise regularly at least 20 minutes 5 times a week. If you develop chest pain, have severe difficulty breathing, or feel very tired , stop exercising immediately and seek medical attention. 3)  Check your Blood Pressure regularly. If it is above 140/90: you should make an appointment.   Orders Added: 1)  Est. Patient Level III [[address]]

## 2011-01-22 LAB — CBC
HCT: 40.7 % (ref 39.0–52.0)
Hemoglobin: 13.7 g/dL (ref 13.0–17.0)
MCV: 83.1 fL (ref 78.0–100.0)
RBC: 4.9 MIL/uL (ref 4.22–5.81)
RDW: 13.8 % (ref 11.5–15.5)
WBC: 9.3 10*3/uL (ref 4.0–10.5)

## 2011-01-22 LAB — POCT CARDIAC MARKERS
CKMB, poc: 2.7 ng/mL (ref 1.0–8.0)
Myoglobin, poc: 136 ng/mL (ref 12–200)

## 2011-01-22 LAB — URINALYSIS, ROUTINE W REFLEX MICROSCOPIC
Glucose, UA: NEGATIVE mg/dL
Hgb urine dipstick: NEGATIVE
Specific Gravity, Urine: 1.01 (ref 1.005–1.030)
pH: 6 (ref 5.0–8.0)

## 2011-01-22 LAB — PROTIME-INR
INR: 1.03 (ref 0.00–1.49)
Prothrombin Time: 13.7 seconds (ref 11.6–15.2)

## 2011-01-22 LAB — BASIC METABOLIC PANEL
BUN: 12 mg/dL (ref 6–23)
Chloride: 107 mEq/L (ref 96–112)
GFR calc Af Amer: >60 mL/min (ref >60)
GFR calc non Af Amer: 60 mL/min — ABNORMAL LOW (ref >60)
Potassium: 3.9 mEq/L (ref 3.5–5.1)
Sodium: 137 mEq/L (ref 135–145)

## 2011-01-22 LAB — COMPREHENSIVE METABOLIC PANEL
ALT: 25 U/L (ref 0–53)
AST: 30 U/L (ref 0–37)
Calcium: 9.2 mg/dL (ref 8.4–10.5)
Creatinine, Ser: 1.15 mg/dL (ref 0.4–1.5)
GFR calc Af Amer: >60 mL/min (ref >60)
Sodium: 138 mEq/L (ref 135–145)
Total Protein: 7.1 g/dL (ref 6.0–8.3)

## 2011-01-22 LAB — LIPID PANEL
Cholesterol: 124 mg/dL (ref 0–200)
HDL: 28 mg/dL — ABNORMAL LOW (ref >39)

## 2011-01-22 LAB — CARDIAC PANEL(CRET KIN+CKTOT+MB+TROPI)
CK, MB: 4.5 ng/mL — ABNORMAL HIGH (ref 0.3–4.0)
Relative Index: 1.8 (ref 0.0–2.5)
Troponin I: 0.01 ng/mL (ref 0.00–0.06)

## 2011-01-22 LAB — CK TOTAL AND CKMB (NOT AT ARMC)
CK, MB: 5.3 ng/mL — ABNORMAL HIGH (ref 0.3–4.0)
CK, MB: 6.8 ng/mL (ref 0.3–4.0)
Total CK: 269 U/L — ABNORMAL HIGH (ref 7–232)
Total CK: 345 U/L — ABNORMAL HIGH (ref 7–232)

## 2011-01-22 LAB — TSH: TSH: 3.522 u[IU]/mL (ref 0.350–4.500)

## 2011-01-22 LAB — LIPASE, BLOOD: Lipase: 50 U/L (ref 11–59)

## 2011-07-22 ENCOUNTER — Other Ambulatory Visit: Payer: Self-pay | Admitting: Internal Medicine

## 2011-10-25 ENCOUNTER — Other Ambulatory Visit: Payer: Self-pay | Admitting: Internal Medicine

## 2011-10-28 ENCOUNTER — Other Ambulatory Visit: Payer: Self-pay | Admitting: Internal Medicine

## 2011-10-28 MED ORDER — METOPROLOL TARTRATE 25 MG PO TABS: 25.0000 mg | ORAL_TABLET | Freq: Two times a day (BID) | ORAL | Status: DC

## 2011-10-30 ENCOUNTER — Ambulatory Visit (INDEPENDENT_AMBULATORY_CARE_PROVIDER_SITE_OTHER): Payer: Medicare Other | Admitting: Internal Medicine

## 2011-10-30 ENCOUNTER — Encounter: Payer: Self-pay | Admitting: Internal Medicine

## 2011-10-30 ENCOUNTER — Other Ambulatory Visit (INDEPENDENT_AMBULATORY_CARE_PROVIDER_SITE_OTHER): Payer: Medicare Other

## 2011-10-30 VITALS — BP 128/72 | HR 54 | Temp 97.5°F | Resp 16 | Wt 158.2 lb

## 2011-10-30 DIAGNOSIS — N138 Other obstructive and reflux uropathy: Secondary | ICD-10-CM

## 2011-10-30 DIAGNOSIS — R7309 Other abnormal glucose: Secondary | ICD-10-CM

## 2011-10-30 DIAGNOSIS — N401 Enlarged prostate with lower urinary tract symptoms: Secondary | ICD-10-CM

## 2011-10-30 DIAGNOSIS — I10 Essential (primary) hypertension: Secondary | ICD-10-CM

## 2011-10-30 DIAGNOSIS — Z23 Encounter for immunization: Secondary | ICD-10-CM

## 2011-10-30 DIAGNOSIS — E7800 Pure hypercholesterolemia, unspecified: Secondary | ICD-10-CM

## 2011-10-30 DIAGNOSIS — H409 Unspecified glaucoma: Secondary | ICD-10-CM

## 2011-10-30 DIAGNOSIS — R739 Hyperglycemia, unspecified: Secondary | ICD-10-CM

## 2011-10-30 DIAGNOSIS — Z0000 Encounter for general adult medical examination without abnormal findings: Secondary | ICD-10-CM

## 2011-10-30 DIAGNOSIS — R972 Elevated prostate specific antigen [PSA]: Secondary | ICD-10-CM

## 2011-10-30 LAB — LIPID PANEL
HDL: 32 mg/dL — ABNORMAL LOW (ref >39.00)
LDL Cholesterol: 106 mg/dL — ABNORMAL HIGH (ref 0–99)
Total CHOL/HDL Ratio: 5
VLDL: 22.6 mg/dL (ref 0.0–40.0)

## 2011-10-30 LAB — CBC WITH DIFFERENTIAL/PLATELET
Basophils Absolute: 0.1 10*3/uL (ref 0.0–0.1)
Basophils Relative: 0.7 % (ref 0.0–3.0)
Eosinophils Absolute: 0.3 10*3/uL (ref 0.0–0.7)
HCT: 42.5 % (ref 39.0–52.0)
Hemoglobin: 14.4 g/dL (ref 13.0–17.0)
Lymphocytes Relative: 17 % (ref 12.0–46.0)
Lymphs Abs: 1.5 10*3/uL (ref 0.7–4.0)
MCHC: 33.9 g/dL (ref 30.0–36.0)
Monocytes Relative: 8.6 % (ref 3.0–12.0)
Neutro Abs: 6 10*3/uL (ref 1.4–7.7)
RBC: 4.85 Mil/uL (ref 4.22–5.81)
RDW: 14.4 % (ref 11.5–14.6)

## 2011-10-30 LAB — URINALYSIS, ROUTINE W REFLEX MICROSCOPIC
Bilirubin Urine: NEGATIVE
Hgb urine dipstick: NEGATIVE
Ketones, ur: NEGATIVE
Leukocytes, UA: NEGATIVE
Specific Gravity, Urine: 1.025 (ref 1.000–1.030)
Urine Glucose: NEGATIVE
Urobilinogen, UA: 0.2 (ref 0.0–1.0)

## 2011-10-30 LAB — TSH: TSH: 1.72 u[IU]/mL (ref 0.35–5.50)

## 2011-10-30 LAB — COMPREHENSIVE METABOLIC PANEL
ALT: 26 U/L (ref 0–53)
CO2: 27 mEq/L (ref 19–32)
Calcium: 9.5 mg/dL (ref 8.4–10.5)
Chloride: 107 mEq/L (ref 96–112)
Creatinine, Ser: 1.3 mg/dL (ref 0.4–1.5)
GFR: 67.6 mL/min (ref >60.00)
Glucose, Bld: 129 mg/dL — ABNORMAL HIGH (ref 70–99)
Sodium: 142 mEq/L (ref 135–145)
Total Bilirubin: 1 mg/dL (ref 0.3–1.2)
Total Protein: 7.4 g/dL (ref 6.0–8.3)

## 2011-10-30 LAB — PSA: PSA: 6.66 ng/mL — ABNORMAL HIGH (ref 0.10–4.00)

## 2011-10-30 MED ORDER — METOPROLOL TARTRATE 25 MG PO TABS: 25.0000 mg | ORAL_TABLET | Freq: Two times a day (BID) | ORAL | Status: DC

## 2011-10-30 NOTE — Assessment & Plan Note (Signed)
His BP is well controlled, I will check his lytes today 

## 2011-10-30 NOTE — Assessment & Plan Note (Signed)
i will check his a1c today

## 2011-10-30 NOTE — Assessment & Plan Note (Signed)
His exam is unchanged

## 2011-10-30 NOTE — Assessment & Plan Note (Signed)
I will check his FLP today 

## 2011-10-30 NOTE — Patient Instructions (Signed)

## 2011-10-30 NOTE — Assessment & Plan Note (Signed)
His exam is unchanged, I will recheck his PSA today

## 2011-10-30 NOTE — Assessment & Plan Note (Signed)
ophth referral 

## 2011-10-30 NOTE — Progress Notes (Signed)
Subjective:    Patient ID: Jonathan Daniels, male    DOB: Dec 17, 1935, 75 y.o.   MRN: [id number]  Hypertension This is a chronic problem. The current episode started more than 1 year ago. The problem has been gradually improving since onset. The problem is controlled. Pertinent negatives include no anxiety, blurred vision, chest pain, headaches, malaise/fatigue, neck pain, orthopnea, palpitations, peripheral edema, PND, shortness of breath or sweats. There are no associated agents to hypertension. Past treatments include beta blockers. The current treatment provides significant improvement. Compliance problems include exercise and diet.  There is no history of chronic renal disease.  Hyperlipidemia This is a chronic problem. The current episode started more than 1 year ago. The problem is controlled. Recent lipid tests were reviewed and are variable. He has no history of chronic renal disease, diabetes, hypothyroidism, liver disease, obesity or nephrotic syndrome. Factors aggravating his hyperlipidemia include no known factors. Pertinent negatives include no chest pain, focal sensory loss, focal weakness, leg pain, myalgias or shortness of breath. Treatments tried: fish oil. The current treatment provides moderate improvement of lipids. Compliance problems include adherence to exercise and adherence to diet.       Review of Systems  Constitutional: Negative for fever, chills, malaise/fatigue, diaphoresis, activity change, appetite change, fatigue and unexpected weight change.  HENT: Negative.  Negative for neck pain.   Eyes: Negative.  Negative for blurred vision.  Respiratory: Negative for apnea, cough, choking, chest tightness, shortness of breath, wheezing and stridor.   Cardiovascular: Negative for chest pain, palpitations, orthopnea, leg swelling and PND.  Gastrointestinal: Negative for nausea, vomiting, abdominal pain, diarrhea, constipation, blood in stool, abdominal distention and anal bleeding.    Genitourinary: Negative for dysuria, urgency, frequency, hematuria, flank pain, decreased urine volume, discharge, penile swelling, scrotal swelling, enuresis, difficulty urinating, genital sores, penile pain and testicular pain.  Musculoskeletal: Negative.  Negative for myalgias.  Skin: Negative for color change, pallor, rash and wound.  Neurological: Negative for dizziness, tremors, focal weakness, seizures, syncope, facial asymmetry, speech difficulty, weakness, light-headedness, numbness and headaches.  Hematological: Negative for adenopathy. Does not bruise/bleed easily.  Psychiatric/Behavioral: Negative.        Objective:   Physical Exam  Vitals reviewed. Constitutional: He is oriented to person, place, and time. He appears well-developed and well-nourished. No distress.  HENT:  Head: Normocephalic and atraumatic.  Mouth/Throat: Oropharynx is clear and moist. No oropharyngeal exudate.  Eyes: Conjunctivae are normal. Right eye exhibits no discharge. Left eye exhibits no discharge. No scleral icterus.  Neck: Normal range of motion. Neck supple. No JVD present. No tracheal deviation present. No thyromegaly present.  Cardiovascular: Normal rate, regular rhythm, normal heart sounds and intact distal pulses.  Exam reveals no gallop and no friction rub.   No murmur heard. Pulmonary/Chest: Effort normal and breath sounds normal. No stridor. No respiratory distress. He has no wheezes. He has no rales. He exhibits no tenderness.  Abdominal: Soft. Bowel sounds are normal. He exhibits no distension and no mass. There is no tenderness. There is no rebound and no guarding. Hernia confirmed negative in the right inguinal area and confirmed negative in the left inguinal area.  Genitourinary: Rectum normal, testes normal and penis normal. Rectal exam shows no external hemorrhoid, no internal hemorrhoid, no fissure, no mass, no tenderness and anal tone normal. Guaiac negative stool. Prostate is enlarged  (2+ symmetrical hypertrophy). Prostate is not tender. Right testis shows no mass, no swelling and no tenderness. Right testis is descended. Left testis shows no mass,  no swelling and no tenderness. Left testis is descended. Uncircumcised. No phimosis, paraphimosis, hypospadias, penile erythema or penile tenderness. No discharge found.  Musculoskeletal: Normal range of motion. He exhibits no edema and no tenderness.  Lymphadenopathy:    He has no cervical adenopathy.       Right: No inguinal adenopathy present.       Left: No inguinal adenopathy present.  Neurological: He is oriented to person, place, and time.  Skin: Skin is warm and dry. No rash noted. He is not diaphoretic. No erythema. No pallor.  Psychiatric: He has a normal mood and affect. His behavior is normal. Judgment and thought content normal.      Lab Results  Component Value Date   WBC 9.3 09/15/2010   HGB 13.7 09/15/2010   HCT 40.7 09/15/2010   PLT 168 09/15/2010   GLUCOSE 113* 09/15/2010   CHOL  Value: 124 (NOTE) ATP III Classification:      < 200        mg/dL        Desirable     960 - 239     mg/dL        Borderline High     >= 240        mg/dL        High  45/02/980   TRIG 122 09/14/2010   HDL 28* 09/14/2010   LDLCALC  Value: 72 (NOTE)  Total Cholesterol/HDL Ratio:CHD Risk                       Coronary Heart Disease Risk Table                                       Men       Women         1/2 Average Risk              3.4        3.3             Average Risk              5.0         4.4         2 X Average Risk              9.6        7.1         3 X Average Risk             23.4       11.0 Use the calculated Patient Ratio above and the CHD Risk table  to determine the patient's CHD Risk. ATP III Classification (LDL):      < 100         mg/dL         Optimal     191 - 129     mg/dL         Near or Above Optimal     130 - 159     mg/dL         Borderline High     160 - 189     mg/dL         High      > 478        mg/dL         Very  High  29/03/6212  ALT 25 09/13/2010   AST 30 09/13/2010   NA 137 09/15/2010   K 3.9 09/15/2010   CL 107 09/15/2010   CREATININE 1.20 09/15/2010   BUN 12 09/15/2010   CO2 25 09/15/2010   TSH 3.522 09/14/2010   PSA 6.13* 09/24/2010   INR 1.03 09/14/2010      Assessment & Plan:

## 2011-10-30 NOTE — Assessment & Plan Note (Signed)

## 2011-11-26 ENCOUNTER — Other Ambulatory Visit: Payer: Self-pay | Admitting: Internal Medicine

## 2011-11-29 ENCOUNTER — Ambulatory Visit: Payer: Medicare Other | Admitting: Internal Medicine

## 2011-12-04 ENCOUNTER — Ambulatory Visit (INDEPENDENT_AMBULATORY_CARE_PROVIDER_SITE_OTHER): Payer: Medicare Other | Admitting: Internal Medicine

## 2011-12-04 ENCOUNTER — Encounter: Payer: Self-pay | Admitting: Internal Medicine

## 2011-12-04 DIAGNOSIS — R972 Elevated prostate specific antigen [PSA]: Secondary | ICD-10-CM

## 2011-12-04 DIAGNOSIS — E119 Type 2 diabetes mellitus without complications: Secondary | ICD-10-CM | POA: Insufficient documentation

## 2011-12-04 DIAGNOSIS — N138 Other obstructive and reflux uropathy: Secondary | ICD-10-CM

## 2011-12-04 DIAGNOSIS — I10 Essential (primary) hypertension: Secondary | ICD-10-CM

## 2011-12-04 DIAGNOSIS — E7800 Pure hypercholesterolemia, unspecified: Secondary | ICD-10-CM

## 2011-12-04 DIAGNOSIS — N401 Enlarged prostate with lower urinary tract symptoms: Secondary | ICD-10-CM

## 2011-12-04 DIAGNOSIS — IMO0001 Reserved for inherently not codable concepts without codable children: Secondary | ICD-10-CM

## 2011-12-04 LAB — HM DIABETES FOOT EXAM: HM Diabetic Foot Exam: NORMAL

## 2011-12-04 LAB — HM DIABETES EYE EXAM

## 2011-12-04 MED ORDER — ATORVASTATIN CALCIUM 40 MG PO TABS: 40.0000 mg | ORAL_TABLET | Freq: Every day | ORAL | Status: DC

## 2011-12-04 MED ORDER — OLMESARTAN MEDOXOMIL 20 MG PO TABS: 20.0000 mg | ORAL_TABLET | Freq: Every day | ORAL | Status: DC

## 2011-12-04 MED ORDER — SAXAGLIPTIN HCL 5 MG PO TABS: 5.0000 mg | ORAL_TABLET | Freq: Every day | ORAL | Status: DC

## 2011-12-04 NOTE — Assessment & Plan Note (Signed)
His PSA has not had a significant increase since Nov 2011 so I don't think this is Ca but it is BPH

## 2011-12-04 NOTE — Patient Instructions (Signed)
Hypertension As your heart beats, it forces blood through your arteries. This force is your blood pressure. If the pressure is too high, it is called hypertension (HTN) or high blood pressure. HTN is dangerous because you may have it and not know it. High blood pressure may mean that your heart has to work harder to pump blood. Your arteries may be narrow or stiff. The extra work puts you at risk for heart disease, stroke, and other problems.  Blood pressure consists of two numbers, a higher number over a lower, 110/72, for example. It is stated as "110 over 72." The ideal is below 120 for the top number (systolic) and under 80 for the bottom (diastolic). Write down your blood pressure today. You should pay close attention to your blood pressure if you have certain conditions such as:  Heart failure.   Prior heart attack.   Diabetes   Chronic kidney disease.   Prior stroke.   Multiple risk factors for heart disease.  To see if you have HTN, your blood pressure should be measured while you are seated with your arm held at the level of the heart. It should be measured at least twice. A one-time elevated blood pressure reading (especially in the Emergency Department) does not mean that you need treatment. There may be conditions in which the blood pressure is different between your right and left arms. It is important to see your caregiver soon for a recheck. Most people have essential hypertension which means that there is not a specific cause. This type of high blood pressure may be lowered by changing lifestyle factors such as:  Stress.   Smoking.   Lack of exercise.   Excessive weight.   Drug/tobacco/alcohol use.   Eating less salt.  Most people do not have symptoms from high blood pressure until it has caused damage to the body. Effective treatment can often prevent, delay or reduce that damage. TREATMENT  When a cause has been identified, treatment for high blood pressure is  directed at the cause. There are a large number of medications to treat HTN. These fall into several categories, and your caregiver will help you select the medicines that are best for you. Medications may have side effects. You should review side effects with your caregiver. If your blood pressure stays high after you have made lifestyle changes or started on medicines,   Your medication(s) may need to be changed.   Other problems may need to be addressed.   Be certain you understand your prescriptions, and know how and when to take your medicine.   Be sure to follow up with your caregiver within the time frame advised (usually within two weeks) to have your blood pressure rechecked and to review your medications.   If you are taking more than one medicine to lower your blood pressure, make sure you know how and at what times they should be taken. Taking two medicines at the same time can result in blood pressure that is too low.  SEEK IMMEDIATE MEDICAL CARE IF:  You develop a severe headache, blurred or changing vision, or confusion.   You have unusual weakness or numbness, or a faint feeling.   You have severe chest or abdominal pain, vomiting, or breathing problems.  MAKE SURE YOU:   Understand these instructions.   Will watch your condition.   Will get help right away if you are not doing well or get worse.  Document Released: 10/28/2005 Document Revised: 07/10/2011 Document Reviewed:   06/17/2008 Exit[employer] Patient Information 2012 [employer].Diabetes, Type 2 Diabetes is a long-lasting (chronic) disease. In type 2 diabetes, the pancreas does not make enough insulin (a hormone), and the body does not respond normally to the insulin that is made. This type of diabetes was also previously called adult-onset diabetes. It usually occurs after the age of 40, but it can occur at any age.  CAUSES  Type 2 diabetes happens because the pancreasis not making enough insulin or your body has  trouble using the insulin that your pancreas does make properly. SYMPTOMS   Drinking more than usual.   Urinating more than usual.   Blurred vision.   Dry, itchy skin.   Frequent infections.   Feeling more tired than usual (fatigue).  DIAGNOSIS The diagnosis of type 2 diabetes is usually made by one of the following tests:  Fasting blood glucose test. You will not eat for at least 8 hours and then take a blood test.   Random blood glucose test. Your blood glucose (sugar) is checked at any time of the day regardless of when you ate.   Oral glucose tolerance test (OGTT). Your blood glucose is measured after you have not eaten (fasted) and then after you drink a glucose containing beverage.  TREATMENT   Healthy eating.   Exercise.   Medicine, if needed.   Monitoring blood glucose.   Seeing your caregiver regularly.  HOME CARE INSTRUCTIONS   Check your blood glucose at least once a day. More frequent monitoring may be necessary, depending on your medicines and on how well your diabetes is controlled. Your caregiver will advise you.   Take your medicine as directed by your caregiver.   Do not smoke.   Make wise food choices. Ask your caregiver for information. Weight loss can improve your diabetes.   Learn about low blood glucose (hypoglycemia) and how to treat it.   Get your eyes checked regularly.   Have a yearly physical exam. Have your blood pressure checked and your blood and urine tested.   Wear a pendant or bracelet saying that you have diabetes.   Check your feet every night for cuts, sores, blisters, and redness. Let your caregiver know if you have any problems.  SEEK MEDICAL CARE IF:   You have problems keeping your blood glucose in target range.   You have problems with your medicines.   You have symptoms of an illness that do not improve after 24 hours.   You have a sore or wound that is not healing.   You notice a change in vision or a new problem  with your vision.   You have a fever.  MAKE SURE YOU:  Understand these instructions.   Will watch your condition.   Will get help right away if you are not doing well or get worse.  Document Released: 10/28/2005 Document Revised: 07/11/2011 Document Reviewed: 04/15/2011 [employer] Patient Information 2012 [employer]. 

## 2011-12-04 NOTE — Assessment & Plan Note (Signed)
Start lipitor

## 2011-12-04 NOTE — Assessment & Plan Note (Signed)
His BP is not well controlled so I added benicar for BP control as well as renal protection, he agrees to continue taking metoprolol

## 2011-12-04 NOTE — Progress Notes (Signed)
Subjective:    Patient ID: Jonathan Daniels, male    DOB: [birth year], 76 y.o.   MRN: [id number]  Hypertension This is a chronic problem. The current episode started more than 1 year ago. The problem has been gradually worsening since onset. The problem is uncontrolled. Pertinent negatives include no anxiety, blurred vision, chest pain, headaches, malaise/fatigue, neck pain, orthopnea, palpitations, peripheral edema, PND, shortness of breath or sweats. Past treatments include beta blockers. The current treatment provides mild improvement. Compliance problems include exercise and diet.   Diabetes He presents for his initial diabetic visit. He has type 2 diabetes mellitus. His disease course has been stable. There are no hypoglycemic associated symptoms. Pertinent negatives for hypoglycemia include no dizziness, headaches, pallor, seizures, speech difficulty, sweats or tremors. Pertinent negatives for diabetes include no blurred vision, no chest pain, no fatigue, no foot paresthesias, no foot ulcerations, no polydipsia, no polyphagia, no polyuria, no visual change, no weakness and no weight loss. There are no hypoglycemic complications. Symptoms are stable. There are no diabetic complications. When asked about current treatments, none were reported. His weight is stable. He is following a generally healthy diet. Meal planning includes avoidance of concentrated sweets. He has not had a previous visit with a dietician. He participates in exercise intermittently. An ACE inhibitor/angiotensin II receptor blocker is not being taken. He does not see a podiatrist.Eye exam is not current.      Review of Systems  Constitutional: Negative for fever, chills, weight loss, malaise/fatigue, diaphoresis, activity change, appetite change, fatigue and unexpected weight change.  HENT: Negative.  Negative for neck pain.   Eyes: Negative.  Negative for blurred vision.  Respiratory: Negative for cough, chest tightness, shortness  of breath, wheezing and stridor.   Cardiovascular: Negative for chest pain, palpitations, orthopnea, leg swelling and PND.  Gastrointestinal: Negative for nausea, vomiting, abdominal pain, diarrhea, constipation, blood in stool and abdominal distention.  Genitourinary: Negative for dysuria, urgency, polyuria, frequency, hematuria, flank pain, decreased urine volume, discharge, penile swelling, scrotal swelling, enuresis, difficulty urinating, genital sores, penile pain and testicular pain.  Musculoskeletal: Negative for myalgias, back pain, joint swelling, arthralgias and gait problem.  Skin: Negative for color change, pallor, rash and wound.  Neurological: Negative for dizziness, tremors, seizures, syncope, facial asymmetry, speech difficulty, weakness, light-headedness, numbness and headaches.  Hematological: Negative for polydipsia, polyphagia and adenopathy. Does not bruise/bleed easily.  Psychiatric/Behavioral: Negative.        Objective:   Physical Exam  Vitals reviewed. Constitutional: He is oriented to person, place, and time. He appears well-developed and well-nourished. No distress.  HENT:  Head: Normocephalic and atraumatic.  Mouth/Throat: Oropharynx is clear and moist. No oropharyngeal exudate.  Eyes: Conjunctivae are normal. Right eye exhibits no discharge. Left eye exhibits no discharge. No scleral icterus.  Neck: Normal range of motion. Neck supple. No JVD present. No tracheal deviation present. No thyromegaly present.  Cardiovascular: Normal rate, regular rhythm, normal heart sounds and intact distal pulses.  Exam reveals no gallop and no friction rub.   No murmur heard. Pulmonary/Chest: Effort normal and breath sounds normal. No stridor. No respiratory distress. He has no wheezes. He has no rales. He exhibits no tenderness.  Abdominal: Soft. Bowel sounds are normal. He exhibits no distension and no mass. There is no tenderness. There is no rebound and no guarding.    Musculoskeletal: Normal range of motion. He exhibits no edema and no tenderness.  Lymphadenopathy:    He has no cervical adenopathy.  Neurological: He is oriented  to person, place, and time.  Skin: Skin is warm and dry. No rash noted. He is not diaphoretic. No erythema. No pallor.  Psychiatric: He has a normal mood and affect. His behavior is normal. Judgment and thought content normal.          Assessment & Plan:

## 2011-12-04 NOTE — Assessment & Plan Note (Signed)
Start onglyza and benicar, referred for eye exam and diabetic education

## 2012-02-03 ENCOUNTER — Encounter: Payer: Medicare Other | Attending: Internal Medicine | Admitting: *Deleted

## 2012-02-03 ENCOUNTER — Encounter: Payer: Self-pay | Admitting: *Deleted

## 2012-02-03 VITALS — Ht 65.5 in | Wt 155.4 lb

## 2012-02-03 DIAGNOSIS — IMO0001 Reserved for inherently not codable concepts without codable children: Secondary | ICD-10-CM

## 2012-02-03 DIAGNOSIS — Z713 Dietary counseling and surveillance: Secondary | ICD-10-CM | POA: Insufficient documentation

## 2012-02-03 DIAGNOSIS — E119 Type 2 diabetes mellitus without complications: Secondary | ICD-10-CM | POA: Insufficient documentation

## 2012-02-03 NOTE — Patient Instructions (Addendum)
Goals:  Follow Diabetes Meal Plan as instructed (see yellow card).  Limit sugar-sweetened beverages and concentrated sweets.  Eat 3 meals and 2 snacks, every 3-5 hrs - Avoid meal skipping.  Add lean protein foods to all meals/snacks.  Monitor glucose levels as instructed by your doctor.  Aim for 30+ mins of physical activity daily.

## 2012-02-03 NOTE — Progress Notes (Signed)
Medical Nutrition Therapy:  Appt start time: 1100 end time:  1200.  ASSESSMENT: T2DM, New Onset.  Pt here with spouse for DM education.  New diagnosis 2-3 months ago and recent A1c of 7.1% (10/30/11) noted. Pt not instructed by MD to check BG, though wants to know if he should be. Directed back to MD. Partial food recall reveals meal skipping several days/week and excessive CHO soda. Pt also consumes fried foods often. Pt reports he walks his dog daily, though does not get any additional exercise. No sx of hyperglycemia reported.    MEDICATIONS: See medication list; reconciled with pt   DIETARY INTAKE:  Usual eating pattern includes 2-3 meals and 0 snacks per day.  24-hr recall: Unable to obtain full recall d/t multiple questions from patient and inability to hear well.  B ( AM): Cereal w/ milk; Cheerwine  Snk ( AM): none  L ( PM): Cheerwine Snk ( PM): none D ( PM): Fried chicken wings (3 ea); Cheerwine Snk ( PM): none Beverages: Cheerwine, milk  Usual physical activity: Walks dog 2 miles a day  Estimated energy needs: 1500-1600 calories 170-180 g carbohydrates 110-120 g protein 40-50 g fat  Progress Towards Goal(s):  In progress.   Nutritional Diagnosis:  [address]-2.1 Impaired nutrient utilization related to glucose metabolism as evidenced by a recent A1c of 7.1% and new diagnosis of T2DM.    Intervention: Nutrition education (see pt instructions).  Handouts given during visit include:  Living Well with Diabetes - [employer] book  60g CHO meals  Snack List  Monitoring/Evaluation:  Dietary intake, exercise, A1c, BG trends, and body weight in 6-8 week(s).

## 2012-02-28 ENCOUNTER — Other Ambulatory Visit: Payer: Self-pay

## 2012-02-28 MED ORDER — SAXAGLIPTIN HCL 5 MG PO TABS: 5.0000 mg | ORAL_TABLET | Freq: Every day | ORAL | Status: DC

## 2012-04-13 ENCOUNTER — Encounter: Payer: Self-pay | Admitting: *Deleted

## 2012-04-13 ENCOUNTER — Encounter: Payer: Medicare Other | Attending: Internal Medicine | Admitting: *Deleted

## 2012-04-13 VITALS — Ht 65.5 in | Wt 149.7 lb

## 2012-04-13 DIAGNOSIS — E119 Type 2 diabetes mellitus without complications: Secondary | ICD-10-CM | POA: Insufficient documentation

## 2012-04-13 DIAGNOSIS — Z713 Dietary counseling and surveillance: Secondary | ICD-10-CM | POA: Insufficient documentation

## 2012-04-13 DIAGNOSIS — IMO0001 Reserved for inherently not codable concepts without codable children: Secondary | ICD-10-CM

## 2012-04-13 NOTE — Patient Instructions (Addendum)
Goals:  Follow Diabetes Meal Plan as instructed (see yellow card).  Limit sugar-sweetened beverages and concentrated sweets.  Choose 4 carb choices per meal and 1 choice per snack.  Add protein to all meals and snacks.  Eat 3 meals and 2 snacks, every 3-5 hrs - Avoid meal skipping.  Aim for 30+ mins of physical activity daily.  Follow up with doctor regarding referral for blood glucose monitoring.   Estimated energy needs: 1500-1600 calories 170-180 g carbohydrates 110-120 g protein 40-50 g fat <2000 mg sodium

## 2012-04-13 NOTE — Progress Notes (Addendum)
Medical Nutrition Therapy:  Appt start time: 0945   end time:  1025.  Primary Concerns Today: T2DM, F/U.  Pt returns with spouse for f/u. Last A1c of 7.1% on 10/29/12; no f/u with PCP reported. Has discontinued sugar sweetened soda/drinks, though continues to eat large portions of carbs at some meals (i.e. see breakfast below) and skipping others.  Decreased protein intake also noted.  No hypoglycemic episodes reported. His wife asked again about BGM and states she thinks he should be checking his blood sugar. Recommend referral to [facility] for BGM and recheck of A1c.   MEDICATIONS: See medication list; No changes reported.    DIETARY INTAKE:  Usual eating pattern includes 2 meals and 0-1 snacks per day.  24-hr recall:  B ( AM): 1-1.5 cup Raisin Bran w/ LF milk; Diet Dr. Reino Kent  (110 g carbs vs recommended 60 g) Snk ( AM): none  L ( PM): none Snk ( PM): 2 pcs raisin toast D ( PM): Cabbage soup ("whatever my wife makes")  Snk ( PM): none Beverages: Diet Dr. Reino Kent, milk  Usual physical activity: Walks dog 2 miles a day; lifts weights   Estimated energy needs: 1500-1600 calories 170-180 g carbohydrates 110-120 g protein 40-50 g fat  Progress Towards Goal(s):  In progress.   Nutritional Diagnosis:  [place]-2.1 Impaired nutrient utilization related to glucose metabolism as evidenced by a recent A1c of 7.1% and new diagnosis of T2DM.    Intervention: Nutrition education   Goals:  Follow Diabetes Meal Plan as instructed (see yellow card).  Limit sugar-sweetened beverages and concentrated sweets.  Choose 4 carb choices per meal and 1 choice per snack.  Add protein to all meals and snacks.  Eat 3 meals and 2 snacks, every 3-5 hrs - Avoid meal skipping.  Aim for 30+ mins of physical activity daily.  Follow up with doctor regarding referral for blood glucose monitoring.   Monitoring/Evaluation:  Dietary intake, exercise, A1c, BG trends, and body weight in 8 week(s).

## 2012-06-25 ENCOUNTER — Ambulatory Visit: Payer: Medicare Other | Admitting: *Deleted

## 2012-06-26 ENCOUNTER — Ambulatory Visit: Payer: Medicare Other | Admitting: *Deleted

## 2013-05-27 ENCOUNTER — Ambulatory Visit (INDEPENDENT_AMBULATORY_CARE_PROVIDER_SITE_OTHER): Payer: Medicare Other | Admitting: Internal Medicine

## 2013-05-27 ENCOUNTER — Encounter: Payer: Self-pay | Admitting: Internal Medicine

## 2013-05-27 ENCOUNTER — Ambulatory Visit (INDEPENDENT_AMBULATORY_CARE_PROVIDER_SITE_OTHER)
Admission: RE | Admit: 2013-05-27 | Discharge: 2013-05-27 | Disposition: A | Discharge status: Home / Self Care | Payer: Medicare Other | Source: Ambulatory Visit | Attending: Internal Medicine | Admitting: Internal Medicine

## 2013-05-27 ENCOUNTER — Other Ambulatory Visit (INDEPENDENT_AMBULATORY_CARE_PROVIDER_SITE_OTHER): Payer: Medicare Other

## 2013-05-27 VITALS — BP 150/86 | HR 72 | Temp 97.5°F | Resp 16 | Ht 65.5 in | Wt 150.0 lb

## 2013-05-27 DIAGNOSIS — M25552 Pain in left hip: Secondary | ICD-10-CM

## 2013-05-27 DIAGNOSIS — E7800 Pure hypercholesterolemia, unspecified: Secondary | ICD-10-CM

## 2013-05-27 DIAGNOSIS — R972 Elevated prostate specific antigen [PSA]: Secondary | ICD-10-CM

## 2013-05-27 DIAGNOSIS — M1610 Unilateral primary osteoarthritis, unspecified hip: Secondary | ICD-10-CM

## 2013-05-27 DIAGNOSIS — M1612 Unilateral primary osteoarthritis, left hip: Secondary | ICD-10-CM | POA: Insufficient documentation

## 2013-05-27 DIAGNOSIS — I10 Essential (primary) hypertension: Secondary | ICD-10-CM

## 2013-05-27 DIAGNOSIS — M25559 Pain in unspecified hip: Secondary | ICD-10-CM

## 2013-05-27 DIAGNOSIS — M169 Osteoarthritis of hip, unspecified: Secondary | ICD-10-CM

## 2013-05-27 DIAGNOSIS — Z23 Encounter for immunization: Secondary | ICD-10-CM

## 2013-05-27 DIAGNOSIS — IMO0001 Reserved for inherently not codable concepts without codable children: Secondary | ICD-10-CM

## 2013-05-27 DIAGNOSIS — Z0000 Encounter for general adult medical examination without abnormal findings: Secondary | ICD-10-CM

## 2013-05-27 DIAGNOSIS — F068 Other specified mental disorders due to known physiological condition: Secondary | ICD-10-CM

## 2013-05-27 DIAGNOSIS — G8929 Other chronic pain: Secondary | ICD-10-CM

## 2013-05-27 DIAGNOSIS — F0390 Unspecified dementia without behavioral disturbance: Secondary | ICD-10-CM | POA: Insufficient documentation

## 2013-05-27 HISTORY — DX: Unilateral primary osteoarthritis, left hip: M16.12

## 2013-05-27 LAB — LIPID PANEL
Cholesterol: 143 mg/dL (ref 0–200)
LDL Cholesterol: 86 mg/dL (ref 0–99)
Triglycerides: 133 mg/dL (ref 0.0–149.0)

## 2013-05-27 LAB — CBC WITH DIFFERENTIAL/PLATELET
Basophils Absolute: 0 10*3/uL (ref 0.0–0.1)
Eosinophils Absolute: 0.3 10*3/uL (ref 0.0–0.7)
Lymphocytes Relative: 18.5 % (ref 12.0–46.0)
MCHC: 33.5 g/dL (ref 30.0–36.0)
Neutrophils Relative %: 68.8 % (ref 43.0–77.0)
Platelets: 186 10*3/uL (ref 150.0–400.0)
RBC: 4.75 Mil/uL (ref 4.22–5.81)
RDW: 14.2 % (ref 11.5–14.6)

## 2013-05-27 LAB — COMPREHENSIVE METABOLIC PANEL
ALT: 19 U/L (ref 0–53)
CO2: 26 mEq/L (ref 19–32)
Calcium: 9.5 mg/dL (ref 8.4–10.5)
Chloride: 105 mEq/L (ref 96–112)
Creatinine, Ser: 1.2 mg/dL (ref 0.4–1.5)
GFR: 78.82 mL/min (ref >60.00)
Total Protein: 7.1 g/dL (ref 6.0–8.3)

## 2013-05-27 LAB — URINALYSIS, ROUTINE W REFLEX MICROSCOPIC
RBC / HPF: NONE SEEN (ref 0–?)
Specific Gravity, Urine: 1.02 (ref 1.000–1.030)
Urobilinogen, UA: 0.2 (ref 0.0–1.0)

## 2013-05-27 LAB — HM DIABETES FOOT EXAM

## 2013-05-27 LAB — MICROALBUMIN / CREATININE URINE RATIO: Microalb Creat Ratio: 0.3 mg/g (ref 0.0–30.0)

## 2013-05-27 MED ORDER — SAXAGLIPTIN HCL 5 MG PO TABS: 5.0000 mg | ORAL_TABLET | Freq: Every day | ORAL | Status: DC

## 2013-05-27 MED ORDER — GLUCOSE BLOOD VI STRP: ORAL_STRIP | Status: DC

## 2013-05-27 MED ORDER — ATORVASTATIN CALCIUM 20 MG PO TABS: 20.0000 mg | ORAL_TABLET | Freq: Every day | ORAL | Status: DC

## 2013-05-27 MED ORDER — OLMESARTAN MEDOXOMIL 40 MG PO TABS: 40.0000 mg | ORAL_TABLET | Freq: Every day | ORAL | Status: DC

## 2013-05-27 NOTE — Assessment & Plan Note (Signed)
I have asked him to restart onglyza I will recheck his A1C and renal function today He was referred for an eye exam

## 2013-05-27 NOTE — Assessment & Plan Note (Signed)
Start lipitor for risk reduction

## 2013-05-27 NOTE — Patient Instructions (Signed)
Degenerative Arthritis You have osteoarthritis. This is the wear and tear arthritis that comes with aging. It is also called degenerative arthritis. This is common in people past middle age. It is caused by stress on the joints. The large weight bearing joints of the lower extremities are most often affected. The knees, hips, back, neck, and hands can become painful, swollen, and stiff. This is the most common type of arthritis. It comes on with age, carrying too much weight, or from an injury. Treatment includes resting the sore joint until the pain and swelling improve. Crutches or a walker may be needed for severe flares. Only take over-the-counter or prescription medicines for pain, discomfort, or fever as directed by your caregiver. Local heat therapy may improve motion. Cortisone shots into the joint are sometimes used to reduce pain and swelling during flares. Osteoarthritis is usually not crippling and progresses slowly. There are things you can do to decrease pain:  Avoid high impact activities.  Exercise regularly.  Low impact exercises such as walking, biking and swimming help to keep the muscles strong and keep normal joint function.  Stretching helps to keep your range of motion.  Lose weight if you are overweight. This reduces joint stress. In severe cases when you have pain at rest or increasing disability, joint surgery may be helpful. See your caregiver for follow-up treatment as recommended.  SEEK IMMEDIATE MEDICAL CARE IF:   You have severe joint pain.  Marked swelling and redness in your joint develops.  You develop a high fever. Document Released: 10/28/2005 Document Revised: 01/20/2012 Document Reviewed: 03/30/2007 [employer] Patient Information 2014 [employer]. Health Maintenance, Males A healthy lifestyle and preventative care can promote health and wellness.  Maintain regular health, dental, and eye exams.  Eat a healthy diet. Foods like vegetables, fruits,  whole grains, low-fat dairy products, and lean protein foods contain the nutrients you need without too many calories. Decrease your intake of foods high in solid fats, added sugars, and salt. Get information about a proper diet from your caregiver, if necessary.  Regular physical exercise is one of the most important things you can do for your health. Most adults should get at least 150 minutes of moderate-intensity exercise (any activity that increases your heart rate and causes you to sweat) each week. In addition, most adults need muscle-strengthening exercises on 2 or more days a week.   Maintain a healthy weight. The body mass index (BMI) is a screening tool to identify possible weight problems. It provides an estimate of body fat based on height and weight. Your caregiver can help determine your BMI, and can help you achieve or maintain a healthy weight. For adults 20 years and older:  A BMI below 18.5 is considered underweight.  A BMI of 18.5 to 24.9 is normal.  A BMI of 25 to 29.9 is considered overweight.  A BMI of 30 and above is considered obese.  Maintain normal blood lipids and cholesterol by exercising and minimizing your intake of saturated fat. Eat a balanced diet with plenty of fruits and vegetables. Blood tests for lipids and cholesterol should begin at age 20 and be repeated every 5 years. If your lipid or cholesterol levels are high, you are over 50, or you are a high risk for heart disease, you may need your cholesterol levels checked more frequently.Ongoing high lipid and cholesterol levels should be treated with medicines, if diet and exercise are not effective.  If you smoke, find out from your caregiver how   to quit. If you do not use tobacco, do not start.  If you choose to drink alcohol, do not exceed 2 drinks per day. One drink is considered to be 12 ounces (355 mL) of beer, 5 ounces (148 mL) of wine, or 1.5 ounces (44 mL) of liquor.  Avoid use of street drugs. Do  not share needles with anyone. Ask for help if you need support or instructions about stopping the use of drugs.  High blood pressure causes heart disease and increases the risk of stroke. Blood pressure should be checked at least every 1 to 2 years. Ongoing high blood pressure should be treated with medicines if weight loss and exercise are not effective.  If you are 45 to 75 years old, ask your caregiver if you should take aspirin to prevent heart disease.  Diabetes screening involves taking a blood sample to check your fasting blood sugar level. This should be done once every 3 years, after age 45, if you are within normal weight and without risk factors for diabetes. Testing should be considered at a younger age or be carried out more frequently if you are overweight and have at least 1 risk factor for diabetes.  Colorectal cancer can be detected and often prevented. Most routine colorectal cancer screening begins at the age of 50 and continues through age 75. However, your caregiver may recommend screening at an earlier age if you have risk factors for colon cancer. On a yearly basis, your caregiver may provide home test kits to check for hidden blood in the stool. Use of a small camera at the end of a tube, to directly examine the colon (sigmoidoscopy or colonoscopy), can detect the earliest forms of colorectal cancer. Talk to your caregiver about this at age 50, when routine screening begins. Direct examination of the colon should be repeated every 5 to 10 years through age 75, unless early forms of pre-cancerous polyps or small growths are found.  Hepatitis C blood testing is recommended for all people born from 1945 through 1965 and any individual with known risks for hepatitis C.  Healthy men should no longer receive prostate-specific antigen (PSA) blood tests as part of routine cancer screening. Consult with your caregiver about prostate cancer screening.  Testicular cancer screening is not  recommended for adolescents or adult males who have no symptoms. Screening includes self-exam, caregiver exam, and other screening tests. Consult with your caregiver about any symptoms you have or any concerns you have about testicular cancer.  Practice safe sex. Use condoms and avoid high-risk sexual practices to reduce the spread of sexually transmitted infections (STIs).  Use sunscreen with a sun protection factor (SPF) of 30 or greater. Apply sunscreen liberally and repeatedly throughout the day. You should seek shade when your shadow is shorter than you. Protect yourself by wearing long sleeves, pants, a wide-brimmed hat, and sunglasses year round, whenever you are outdoors.  Notify your caregiver of new moles or changes in moles, especially if there is a change in shape or color. Also notify your caregiver if a mole is larger than the size of a pencil eraser.  A one-time screening for abdominal aortic aneurysm (AAA) and surgical repair of large AAAs by sound wave imaging (ultrasonography) is recommended for ages 65 to 75 years who are current or former smokers.  Stay current with your immunizations. Document Released: 04/25/2008 Document Revised: 01/20/2012 Document Reviewed: 03/25/2011 [employer] Patient Information 2014 [employer].  

## 2013-05-27 NOTE — Progress Notes (Signed)
Subjective:    Patient ID: Jonathan Daniels, male    DOB: 1935/12/01, 77 y.o.   MRN: [id number]  Hip Pain  Incident onset: for more than one year. There was no injury mechanism. The pain is present in the left hip. The quality of the pain is described as aching. The pain is at a severity of 3/10. The pain is mild. The pain has been worsening since onset. Pertinent negatives include no inability to bear weight, loss of motion, loss of sensation, muscle weakness, numbness or tingling. The symptoms are aggravated by movement and weight bearing. He has tried NSAIDs for the symptoms. The treatment provided no relief.      Review of Systems  Constitutional: Negative.  Negative for fever, chills, diaphoresis, activity change, appetite change, fatigue and unexpected weight change.  HENT: Negative.   Eyes: Negative.   Respiratory: Negative.  Negative for apnea, cough, choking, chest tightness, shortness of breath, wheezing and stridor.   Cardiovascular: Negative.  Negative for chest pain, palpitations and leg swelling.  Gastrointestinal: Negative.  Negative for nausea, vomiting, abdominal pain, diarrhea and constipation.  Endocrine: Negative.  Negative for polydipsia, polyphagia and polyuria.  Genitourinary: Negative.  Negative for dysuria, urgency, frequency, hematuria, flank pain and decreased urine volume.  Musculoskeletal: Negative.  Negative for myalgias, back pain, joint swelling, arthralgias and gait problem.  Skin: Negative.   Allergic/Immunologic: Negative.   Neurological: Negative for dizziness, tingling, weakness, light-headedness and numbness.  Hematological: Negative.  Negative for adenopathy. Does not bruise/bleed easily.  Psychiatric/Behavioral: Negative.        Objective:   Physical Exam  Vitals reviewed. Constitutional: He is oriented to person, place, and time. He appears well-developed and well-nourished. No distress.  HENT:  Head: Normocephalic and atraumatic.  Mouth/Throat:  Oropharynx is clear and moist. No oropharyngeal exudate.  Eyes: Conjunctivae are normal. Right eye exhibits no discharge. Left eye exhibits no discharge. No scleral icterus.  Neck: Normal range of motion. Neck supple. No JVD present. No tracheal deviation present. No thyromegaly present.  Cardiovascular: Normal rate, regular rhythm, normal heart sounds and intact distal pulses.  Exam reveals no gallop and no friction rub.   No murmur heard. Pulmonary/Chest: Effort normal and breath sounds normal. No stridor. No respiratory distress. He has no wheezes. He has no rales. He exhibits no tenderness.  Abdominal: Soft. Bowel sounds are normal. He exhibits no distension and no mass. There is no tenderness. There is no rebound and no guarding. Hernia confirmed negative in the right inguinal area and confirmed negative in the left inguinal area.  Genitourinary: Rectum normal, testes normal and penis normal. Rectal exam shows no external hemorrhoid, no internal hemorrhoid, no fissure, no mass and no tenderness. Guaiac negative stool. Prostate is enlarged (2+ smooth symm BPH, no nodules). Prostate is not tender. Right testis shows no mass, no swelling and no tenderness. Right testis is descended. Left testis shows no mass, no swelling and no tenderness. Left testis is descended. Uncircumcised. No phimosis, paraphimosis, hypospadias, penile erythema or penile tenderness. No discharge found.  Musculoskeletal: Normal range of motion. He exhibits no edema and no tenderness.       Left hip: He exhibits bony tenderness. He exhibits normal range of motion, normal strength, no tenderness, no swelling, no crepitus, no deformity and no laceration.  Lymphadenopathy:    He has no cervical adenopathy.       Right: No inguinal adenopathy present.       Left: No inguinal adenopathy present.  Neurological: He  is oriented to person, place, and time.  Skin: Skin is warm and dry. No rash noted. He is not diaphoretic. No erythema.  No pallor.  Psychiatric: He has a normal mood and affect. Thought content normal. His speech is delayed and tangential. His speech is not rapid and/or pressured and not slurred. He is slowed. Cognition and memory are normal. He is communicative. He is inattentive.     Lab Results  Component Value Date   WBC 8.5 10/30/2011   HGB 14.4 10/30/2011   HCT 42.5 10/30/2011   PLT 200.0 10/30/2011   GLUCOSE 129* 10/30/2011   CHOL 161 10/30/2011   TRIG 113.0 10/30/2011   HDL 32.00* 10/30/2011   LDLCALC 106* 10/30/2011   ALT 26 10/30/2011   AST 30 10/30/2011   NA 142 10/30/2011   K 4.5 10/30/2011   CL 107 10/30/2011   CREATININE 1.3 10/30/2011   BUN 21 10/30/2011   CO2 27 10/30/2011   TSH 1.72 10/30/2011   PSA 6.66* 10/30/2011   INR 1.03 09/14/2010   HGBA1C 7.1* 10/30/2011       Assessment & Plan:

## 2013-05-27 NOTE — Assessment & Plan Note (Signed)
Plain film is + for DJD -----> ortho referral

## 2013-05-27 NOTE — Assessment & Plan Note (Signed)
I will recheck his PSA today, if it is rising will refer to urology

## 2013-05-27 NOTE — Assessment & Plan Note (Signed)
He is not interested in treating this

## 2013-05-27 NOTE — Assessment & Plan Note (Addendum)

## 2013-05-27 NOTE — Addendum Note (Signed)
Addended by: Etta Grandchild on: 05/27/2013 12:47 PM   Modules accepted: Orders

## 2013-05-27 NOTE — Assessment & Plan Note (Signed)
Ortho referral  

## 2013-05-27 NOTE — Assessment & Plan Note (Signed)
Check labs today to look for end organ damage and secondary causes Start Benicar

## 2013-06-30 ENCOUNTER — Ambulatory Visit (INDEPENDENT_AMBULATORY_CARE_PROVIDER_SITE_OTHER): Payer: Medicare Other | Admitting: Internal Medicine

## 2013-06-30 ENCOUNTER — Encounter: Payer: Self-pay | Admitting: Internal Medicine

## 2013-06-30 VITALS — BP 138/86 | HR 70 | Temp 97.9°F | Resp 16 | Ht 65.5 in | Wt 151.1 lb

## 2013-06-30 DIAGNOSIS — IMO0001 Reserved for inherently not codable concepts without codable children: Secondary | ICD-10-CM

## 2013-06-30 DIAGNOSIS — I10 Essential (primary) hypertension: Secondary | ICD-10-CM

## 2013-06-30 DIAGNOSIS — M169 Osteoarthritis of hip, unspecified: Secondary | ICD-10-CM

## 2013-06-30 DIAGNOSIS — M1610 Unilateral primary osteoarthritis, unspecified hip: Secondary | ICD-10-CM

## 2013-06-30 DIAGNOSIS — R972 Elevated prostate specific antigen [PSA]: Secondary | ICD-10-CM

## 2013-06-30 NOTE — Assessment & Plan Note (Signed)
He saw ortho about this and was told that he needs hip replacement surgery but he has decided not to do that He has been taking an nsaid with some improvement

## 2013-06-30 NOTE — Progress Notes (Signed)
  Subjective:    Patient ID: Jonathan Daniels, male    DOB: 04-06-1936, 77 y.o.   MRN: [id number]  Hypertension This is a chronic problem. The current episode started more than 1 year ago. The problem is unchanged. The problem is controlled. Pertinent negatives include no anxiety, blurred vision, chest pain, headaches, malaise/fatigue, neck pain, orthopnea, palpitations, peripheral edema, PND, shortness of breath or sweats. Agents associated with hypertension include NSAIDs. Past treatments include angiotensin blockers. The current treatment provides moderate improvement. Compliance problems include diet and exercise.       Review of Systems  Constitutional: Negative.  Negative for malaise/fatigue.  HENT: Negative.  Negative for neck pain.   Eyes: Negative.  Negative for blurred vision.  Respiratory: Negative.  Negative for apnea, cough, choking, chest tightness, shortness of breath, wheezing and stridor.   Cardiovascular: Negative.  Negative for chest pain, palpitations, orthopnea, leg swelling and PND.  Gastrointestinal: Negative.  Negative for nausea, vomiting, abdominal pain, diarrhea and constipation.  Endocrine: Negative.  Negative for polydipsia, polyphagia and polyuria.  Genitourinary: Negative.   Musculoskeletal: Positive for arthralgias (left hip). Negative for myalgias, back pain, joint swelling and gait problem.  Skin: Negative.   Allergic/Immunologic: Negative.   Neurological: Negative.  Negative for dizziness, tremors, weakness, light-headedness, numbness and headaches.  Hematological: Negative.  Negative for adenopathy. Does not bruise/bleed easily.  Psychiatric/Behavioral: Negative.        Objective:   Physical Exam  Vitals reviewed. Constitutional: He is oriented to person, place, and time. He appears well-developed and well-nourished. No distress.  HENT:  Head: Normocephalic and atraumatic.  Mouth/Throat: Oropharynx is clear and moist. No oropharyngeal exudate.  Eyes:  Conjunctivae are normal. Right eye exhibits no discharge. Left eye exhibits no discharge. No scleral icterus.  Neck: Normal range of motion. Neck supple. No JVD present. No tracheal deviation present. No thyromegaly present.  Cardiovascular: Normal rate, regular rhythm, normal heart sounds and intact distal pulses.  Exam reveals no gallop and no friction rub.   No murmur heard. Pulmonary/Chest: Effort normal and breath sounds normal. No stridor. No respiratory distress. He has no wheezes. He has no rales. He exhibits no tenderness.  Abdominal: Soft. Bowel sounds are normal. He exhibits no distension and no mass. There is no tenderness. There is no rebound and no guarding.  Musculoskeletal: Normal range of motion. He exhibits no edema and no tenderness.  Lymphadenopathy:    He has no cervical adenopathy.  Neurological: He is oriented to person, place, and time.  Skin: Skin is warm and dry. No rash noted. He is not diaphoretic. No erythema. No pallor.     Lab Results  Component Value Date   WBC 8.1 05/27/2013   HGB 14.0 05/27/2013   HCT 41.6 05/27/2013   PLT 186.0 05/27/2013   GLUCOSE 125* 05/27/2013   CHOL 143 05/27/2013   TRIG 133.0 05/27/2013   HDL 30.20* 05/27/2013   LDLCALC 86 05/27/2013   ALT 19 05/27/2013   AST 24 05/27/2013   NA 139 05/27/2013   K 4.1 05/27/2013   CL 105 05/27/2013   CREATININE 1.2 05/27/2013   BUN 16 05/27/2013   CO2 26 05/27/2013   TSH 2.73 05/27/2013   PSA 8.75* 05/27/2013   INR 1.03 09/14/2010   HGBA1C 7.3* 05/27/2013   MICROALBUR 0.6 05/27/2013       Assessment & Plan:

## 2013-06-30 NOTE — Patient Instructions (Signed)

## 2013-06-30 NOTE — Assessment & Plan Note (Signed)
His A1C is acceptalbe

## 2013-06-30 NOTE — Assessment & Plan Note (Signed)
His BP is adequately well controlled 

## 2013-06-30 NOTE — Assessment & Plan Note (Signed)
He sees urology soon about this

## 2013-07-15 ENCOUNTER — Encounter (INDEPENDENT_AMBULATORY_CARE_PROVIDER_SITE_OTHER): Payer: Medicare Other | Admitting: Ophthalmology

## 2013-07-15 DIAGNOSIS — E11319 Type 2 diabetes mellitus with unspecified diabetic retinopathy without macular edema: Secondary | ICD-10-CM

## 2013-07-15 DIAGNOSIS — H35039 Hypertensive retinopathy, unspecified eye: Secondary | ICD-10-CM

## 2013-07-15 DIAGNOSIS — I10 Essential (primary) hypertension: Secondary | ICD-10-CM

## 2013-07-15 DIAGNOSIS — H43819 Vitreous degeneration, unspecified eye: Secondary | ICD-10-CM

## 2013-07-15 DIAGNOSIS — E1139 Type 2 diabetes mellitus with other diabetic ophthalmic complication: Secondary | ICD-10-CM

## 2013-07-19 ENCOUNTER — Encounter: Payer: Self-pay | Admitting: Internal Medicine

## 2014-01-20 ENCOUNTER — Other Ambulatory Visit: Payer: Self-pay | Admitting: Orthopedic Surgery

## 2014-01-28 ENCOUNTER — Encounter (HOSPITAL_COMMUNITY): Payer: Self-pay | Admitting: Pharmacy Technician

## 2014-02-01 ENCOUNTER — Encounter (HOSPITAL_COMMUNITY)
Admission: RE | Admit: 2014-02-01 | Discharge: 2014-02-01 | Disposition: A | Discharge status: Home / Self Care | Payer: MEDICARE | Source: Ambulatory Visit | Attending: Anesthesiology | Admitting: Anesthesiology

## 2014-02-01 ENCOUNTER — Encounter (HOSPITAL_COMMUNITY)
Admission: RE | Admit: 2014-02-01 | Discharge: 2014-02-01 | Disposition: A | Discharge status: Home / Self Care | Payer: MEDICARE | Source: Ambulatory Visit | Attending: Orthopedic Surgery | Admitting: Orthopedic Surgery

## 2014-02-01 ENCOUNTER — Encounter (HOSPITAL_COMMUNITY): Payer: Self-pay

## 2014-02-01 DIAGNOSIS — Z01818 Encounter for other preprocedural examination: Secondary | ICD-10-CM | POA: Insufficient documentation

## 2014-02-01 DIAGNOSIS — Z01810 Encounter for preprocedural cardiovascular examination: Secondary | ICD-10-CM | POA: Insufficient documentation

## 2014-02-01 DIAGNOSIS — Z01812 Encounter for preprocedural laboratory examination: Secondary | ICD-10-CM | POA: Insufficient documentation

## 2014-02-01 HISTORY — DX: Frequency of micturition: R35.0

## 2014-02-01 HISTORY — DX: Unspecified osteoarthritis, unspecified site: M19.90

## 2014-02-01 HISTORY — DX: Unspecified glaucoma: H40.9

## 2014-02-01 HISTORY — DX: Effusion, unspecified joint: M25.40

## 2014-02-01 LAB — CBC
HEMATOCRIT: 39.3 % (ref 39.0–52.0)
HEMOGLOBIN: 13.8 g/dL (ref 13.0–17.0)
MCH: 30 pg (ref 26.0–34.0)
MCHC: 35.1 g/dL (ref 30.0–36.0)
MCV: 85.4 fL (ref 78.0–100.0)
Platelets: 180 10*3/uL (ref 150–400)
RBC: 4.6 MIL/uL (ref 4.22–5.81)
RDW: 13.5 % (ref 11.5–15.5)
WBC: 11 10*3/uL — AB (ref 4.0–10.5)

## 2014-02-01 LAB — PROTIME-INR
INR: 0.98 (ref 0.00–1.49)
PROTHROMBIN TIME: 12.8 s (ref 11.6–15.2)

## 2014-02-01 LAB — BASIC METABOLIC PANEL
BUN: 15 mg/dL (ref 6–23)
CHLORIDE: 104 meq/L (ref 96–112)
CO2: 24 mEq/L (ref 19–32)
Calcium: 9.2 mg/dL (ref 8.4–10.5)
Creatinine, Ser: 1.04 mg/dL (ref 0.50–1.35)
GFR calc Af Amer: 78 mL/min — ABNORMAL LOW (ref >90)
GFR, EST NON AFRICAN AMERICAN: 68 mL/min — AB (ref >90)
Glucose, Bld: 121 mg/dL — ABNORMAL HIGH (ref 70–99)
POTASSIUM: 4.3 meq/L (ref 3.7–5.3)
SODIUM: 141 meq/L (ref 137–147)

## 2014-02-01 LAB — TYPE AND SCREEN
ABO/RH(D): O POS
Antibody Screen: NEGATIVE

## 2014-02-01 LAB — SURGICAL PCR SCREEN
MRSA, PCR: NEGATIVE
STAPHYLOCOCCUS AUREUS: NEGATIVE

## 2014-02-01 LAB — ABO/RH: ABO/RH(D): O POS

## 2014-02-01 LAB — APTT: aPTT: 29 seconds (ref 24–37)

## 2014-02-01 NOTE — Progress Notes (Addendum)
Pt doesn't have a cardiologist  Denies ever having an echo/stress test  Heart cath report in epic under media tab from 2011   Denies EKG or CXR in past yr   Medical Md is Dr. Sanda Lingerhomas Jones

## 2014-02-01 NOTE — Pre-Procedure Instructions (Signed)
Jonathan FergusonJames Packman Jr.  02/01/2014   Your procedure is scheduled on:  Tues, Mar 31 @ 7:30 AM  [facility].  Call this number if you have problems the morning of surgery: [phone]   Remember:   Do not eat food or drink liquids after midnight.   Take these medicines the morning of surgery with A SIP OF WATER: Eye Drops              Stop taking your Aspirin,Fish Oil,and Mobic. No Goody's,BC's,Aleve,Ibuprofen,or any Herbal Medications   Do not wear jewelry  Do not wear lotions, powders, or colognes. You may wear deodorant.  Men may shave face and neck.  Do not bring valuables to the hospital.  [facility]                  for any belongings or valuables.               Contacts, dentures or bridgework may not be worn into surgery.  Leave suitcase in the car. After surgery it may be brought to your room.  For patients admitted to the hospital, discharge time is determined by your                treatment team.               Special Instructions:  [place] - Preparing for Surgery  Before surgery, you can play an important role.  Because skin is not sterile, your skin needs to be as free of germs as possible.  You can reduce the number of germs on you skin by washing with CHG (chlorahexidine gluconate) soap before surgery.  CHG is an antiseptic cleaner which kills germs and bonds with the skin to continue killing germs even after washing.  Please DO NOT use if you have an allergy to CHG or antibacterial soaps.  If your skin becomes reddened/irritated stop using the CHG and inform your nurse when you arrive at Short Stay.  Do not shave (including legs and underarms) for at least 48 hours prior to the first CHG shower.  You may shave your face.  Please follow these instructions carefully:   1.  Shower with CHG Soap the night before surgery and the                                morning of Surgery.  2.  If you choose to wash your hair, wash  your hair first as usual with your       normal shampoo.  3.  After you shampoo, rinse your hair and body thoroughly to remove the                      Shampoo.  4.  Use CHG as you would any other liquid soap.  You can apply chg directly       to the skin and wash gently with scrungie or a clean washcloth.  5.  Apply the CHG Soap to your body ONLY FROM THE NECK DOWN.        Do not use on open wounds or open sores.  Avoid contact with your eyes,       ears, mouth and genitals (private parts).  Wash genitals (private parts)       with your normal soap.  6.  Wash thoroughly,  paying special attention to the area where your surgery        will be performed.  7.  Thoroughly rinse your body with warm water from the neck down.  8.  DO NOT shower/wash with your normal soap after using and rinsing off       the CHG Soap.  9.  Pat yourself dry with a clean towel.            10.  Wear clean pajamas.            11.  Place clean sheets on your bed the night of your first shower and do not        sleep with pets.  Day of Surgery  Do not apply any lotions/deoderants the morning of surgery.  Please wear clean clothes to the hospital/surgery center.     Please read over the following fact sheets that you were given: Pain Booklet, Coughing and Deep Breathing, Blood Transfusion Information, MRSA Information and Surgical Site Infection Prevention

## 2014-02-07 MED ORDER — CEFAZOLIN SODIUM-DEXTROSE 2-3 GM-% IV SOLR
2.0000 g | INTRAVENOUS | Status: AC
  Administered 2014-02-08: 2 g via INTRAVENOUS
  Filled 2014-02-07: qty 50

## 2014-02-08 ENCOUNTER — Encounter (HOSPITAL_COMMUNITY)
Admission: RE | Disposition: A | Discharge status: Home Health | Payer: Self-pay | Source: Ambulatory Visit | Attending: Orthopedic Surgery

## 2014-02-08 ENCOUNTER — Encounter (HOSPITAL_COMMUNITY): Payer: Self-pay | Admitting: *Deleted

## 2014-02-08 ENCOUNTER — Inpatient Hospital Stay (HOSPITAL_COMMUNITY): Payer: MEDICARE | Admitting: Anesthesiology

## 2014-02-08 ENCOUNTER — Encounter (HOSPITAL_COMMUNITY): Payer: MEDICARE | Admitting: Anesthesiology

## 2014-02-08 ENCOUNTER — Inpatient Hospital Stay (HOSPITAL_COMMUNITY): Payer: MEDICARE

## 2014-02-08 ENCOUNTER — Inpatient Hospital Stay (HOSPITAL_COMMUNITY)
Admission: RE | Admit: 2014-02-08 | Discharge: 2014-02-10 | DRG: 470 | Disposition: A | Discharge status: Home Health | Payer: MEDICARE | Source: Ambulatory Visit | Attending: Orthopedic Surgery | Admitting: Orthopedic Surgery

## 2014-02-08 DIAGNOSIS — D62 Acute posthemorrhagic anemia: Secondary | ICD-10-CM | POA: Diagnosis not present

## 2014-02-08 DIAGNOSIS — H409 Unspecified glaucoma: Secondary | ICD-10-CM | POA: Diagnosis present

## 2014-02-08 DIAGNOSIS — Z7982 Long term (current) use of aspirin: Secondary | ICD-10-CM

## 2014-02-08 DIAGNOSIS — M169 Osteoarthritis of hip, unspecified: Principal | ICD-10-CM | POA: Diagnosis present

## 2014-02-08 DIAGNOSIS — M1612 Unilateral primary osteoarthritis, left hip: Secondary | ICD-10-CM | POA: Diagnosis present

## 2014-02-08 DIAGNOSIS — Z833 Family history of diabetes mellitus: Secondary | ICD-10-CM

## 2014-02-08 DIAGNOSIS — E119 Type 2 diabetes mellitus without complications: Secondary | ICD-10-CM | POA: Diagnosis present

## 2014-02-08 DIAGNOSIS — M1610 Unilateral primary osteoarthritis, unspecified hip: Principal | ICD-10-CM | POA: Diagnosis present

## 2014-02-08 HISTORY — PX: TOTAL HIP ARTHROPLASTY: SHX124

## 2014-02-08 HISTORY — DX: Unilateral primary osteoarthritis, left hip: M16.12

## 2014-02-08 LAB — CBC
HCT: 36.4 % — ABNORMAL LOW (ref 39.0–52.0)
HEMOGLOBIN: 12.6 g/dL — AB (ref 13.0–17.0)
MCH: 29.8 pg (ref 26.0–34.0)
MCHC: 34.6 g/dL (ref 30.0–36.0)
MCV: 86.1 fL (ref 78.0–100.0)
PLATELETS: 163 10*3/uL (ref 150–400)
RBC: 4.23 MIL/uL (ref 4.22–5.81)
RDW: 13.3 % (ref 11.5–15.5)
WBC: 13.4 10*3/uL — ABNORMAL HIGH (ref 4.0–10.5)

## 2014-02-08 LAB — GLUCOSE, CAPILLARY
GLUCOSE-CAPILLARY: 133 mg/dL — AB (ref 70–99)
GLUCOSE-CAPILLARY: 167 mg/dL — AB (ref 70–99)
Glucose-Capillary: 104 mg/dL — ABNORMAL HIGH (ref 70–99)
Glucose-Capillary: 160 mg/dL — ABNORMAL HIGH (ref 70–99)

## 2014-02-08 LAB — CREATININE, SERUM
CREATININE: 0.95 mg/dL (ref 0.50–1.35)
GFR calc Af Amer: >90 mL/min (ref >90)
GFR calc non Af Amer: 79 mL/min — ABNORMAL LOW (ref >90)

## 2014-02-08 SURGERY — ARTHROPLASTY, HIP, TOTAL,POSTERIOR APPROACH
Anesthesia: General | Site: Hip | Laterality: Left

## 2014-02-08 MED ORDER — ENOXAPARIN SODIUM 40 MG/0.4ML ~~LOC~~ SOLN
40.0000 mg | SUBCUTANEOUS | Status: DC
  Administered 2014-02-09: 40 mg via SUBCUTANEOUS
  Filled 2014-02-08 (×4): qty 0.4

## 2014-02-08 MED ORDER — ASPIRIN 81 MG PO CHEW
81.0000 mg | CHEWABLE_TABLET | Freq: Every day | ORAL | Status: DC
  Administered 2014-02-08 – 2014-02-10 (×3): 81 mg via ORAL
  Filled 2014-02-08 (×3): qty 1

## 2014-02-08 MED ORDER — ONDANSETRON HCL 4 MG/2ML IJ SOLN
INTRAMUSCULAR | Status: AC
  Filled 2014-02-08: qty 2

## 2014-02-08 MED ORDER — KETOROLAC TROMETHAMINE 15 MG/ML IJ SOLN
7.5000 mg | Freq: Four times a day (QID) | INTRAMUSCULAR | Status: AC
  Administered 2014-02-08 – 2014-02-09 (×3): 7.5 mg via INTRAVENOUS
  Filled 2014-02-08 (×4): qty 1

## 2014-02-08 MED ORDER — ASPIRIN 81 MG PO TABS: 81.0000 mg | ORAL_TABLET | Freq: Every day | ORAL | Status: DC

## 2014-02-08 MED ORDER — CEFAZOLIN SODIUM-DEXTROSE 2-3 GM-% IV SOLR
2.0000 g | Freq: Four times a day (QID) | INTRAVENOUS | Status: AC
  Administered 2014-02-08 (×2): 2 g via INTRAVENOUS
  Filled 2014-02-08 (×2): qty 50

## 2014-02-08 MED ORDER — SUCCINYLCHOLINE CHLORIDE 20 MG/ML IJ SOLN
INTRAMUSCULAR | Status: AC
  Filled 2014-02-08: qty 1

## 2014-02-08 MED ORDER — SENNA 8.6 MG PO TABS
1.0000 | ORAL_TABLET | Freq: Two times a day (BID) | ORAL | Status: DC
  Administered 2014-02-08 – 2014-02-09 (×2): 8.6 mg via ORAL
  Filled 2014-02-08 (×5): qty 1

## 2014-02-08 MED ORDER — BACLOFEN 10 MG PO TABS: 10.0000 mg | ORAL_TABLET | Freq: Three times a day (TID) | ORAL | Status: DC

## 2014-02-08 MED ORDER — ENOXAPARIN SODIUM 40 MG/0.4ML ~~LOC~~ SOLN: 40.0000 mg | SUBCUTANEOUS | Status: DC

## 2014-02-08 MED ORDER — HYDROCODONE-ACETAMINOPHEN 10-325 MG PO TABS
1.0000 | ORAL_TABLET | ORAL | Status: DC | PRN
  Filled 2014-02-08: qty 1

## 2014-02-08 MED ORDER — METHOCARBAMOL 500 MG PO TABS
500.0000 mg | ORAL_TABLET | Freq: Four times a day (QID) | ORAL | Status: DC | PRN
  Filled 2014-02-08: qty 1

## 2014-02-08 MED ORDER — KETOROLAC TROMETHAMINE 15 MG/ML IJ SOLN
INTRAMUSCULAR | Status: AC
  Administered 2014-02-08: 7.5 mg via INTRAVENOUS
  Filled 2014-02-08: qty 1

## 2014-02-08 MED ORDER — POTASSIUM CHLORIDE IN NACL 20-0.45 MEQ/L-% IV SOLN
INTRAVENOUS | Status: DC
  Administered 2014-02-09: 01:00:00 via INTRAVENOUS
  Filled 2014-02-08 (×5): qty 1000

## 2014-02-08 MED ORDER — HYDROCODONE-ACETAMINOPHEN 10-325 MG PO TABS: 1.0000 | ORAL_TABLET | Freq: Four times a day (QID) | ORAL | Status: DC | PRN

## 2014-02-08 MED ORDER — LIDOCAINE HCL (CARDIAC) 20 MG/ML IV SOLN
INTRAVENOUS | Status: DC | PRN
  Administered 2014-02-08: 30 mg via INTRAVENOUS

## 2014-02-08 MED ORDER — ACETAMINOPHEN 650 MG RE SUPP: 650.0000 mg | Freq: Four times a day (QID) | RECTAL | Status: DC | PRN

## 2014-02-08 MED ORDER — NEOSTIGMINE METHYLSULFATE 1 MG/ML IJ SOLN
INTRAMUSCULAR | Status: DC | PRN
  Administered 2014-02-08: 4 mg via INTRAVENOUS

## 2014-02-08 MED ORDER — BRIMONIDINE TARTRATE-TIMOLOL 0.2-0.5 % OP SOLN
1.0000 [drp] | Freq: Two times a day (BID) | OPHTHALMIC | Status: DC
  Filled 2014-02-08: qty 5

## 2014-02-08 MED ORDER — EPHEDRINE SULFATE 50 MG/ML IJ SOLN
INTRAMUSCULAR | Status: AC
  Filled 2014-02-08: qty 1

## 2014-02-08 MED ORDER — EPHEDRINE SULFATE 50 MG/ML IJ SOLN
INTRAMUSCULAR | Status: DC | PRN
  Administered 2014-02-08: 15 mg via INTRAVENOUS
  Administered 2014-02-08: 10 mg via INTRAVENOUS

## 2014-02-08 MED ORDER — ALUM & MAG HYDROXIDE-SIMETH 200-200-20 MG/5ML PO SUSP: 30.0000 mL | ORAL | Status: DC | PRN

## 2014-02-08 MED ORDER — HYDROMORPHONE HCL PF 1 MG/ML IJ SOLN: 0.5000 mg | INTRAMUSCULAR | Status: DC | PRN

## 2014-02-08 MED ORDER — HYDROMORPHONE HCL PF 1 MG/ML IJ SOLN
INTRAMUSCULAR | Status: AC
  Filled 2014-02-08: qty 1

## 2014-02-08 MED ORDER — METHOCARBAMOL 100 MG/ML IJ SOLN
500.0000 mg | Freq: Four times a day (QID) | INTRAVENOUS | Status: DC | PRN
  Administered 2014-02-08: 500 mg via INTRAVENOUS
  Filled 2014-02-08: qty 5

## 2014-02-08 MED ORDER — TIMOLOL MALEATE 0.5 % OP SOLN
1.0000 [drp] | Freq: Two times a day (BID) | OPHTHALMIC | Status: DC
  Administered 2014-02-08 – 2014-02-09 (×2): 1 [drp] via OPHTHALMIC
  Filled 2014-02-08: qty 5

## 2014-02-08 MED ORDER — METOCLOPRAMIDE HCL 5 MG/ML IJ SOLN: 5.0000 mg | Freq: Three times a day (TID) | INTRAMUSCULAR | Status: DC | PRN

## 2014-02-08 MED ORDER — ONDANSETRON HCL 4 MG PO TABS: 4.0000 mg | ORAL_TABLET | Freq: Four times a day (QID) | ORAL | Status: DC | PRN

## 2014-02-08 MED ORDER — LATANOPROST 0.005 % OP SOLN
1.0000 [drp] | Freq: Every day | OPHTHALMIC | Status: DC
  Administered 2014-02-08: 1 [drp] via OPHTHALMIC
  Filled 2014-02-08: qty 2.5

## 2014-02-08 MED ORDER — BISACODYL 10 MG RE SUPP: 10.0000 mg | Freq: Every day | RECTAL | Status: DC | PRN

## 2014-02-08 MED ORDER — LIDOCAINE HCL (CARDIAC) 20 MG/ML IV SOLN
INTRAVENOUS | Status: AC
  Filled 2014-02-08: qty 5

## 2014-02-08 MED ORDER — POLYETHYLENE GLYCOL 3350 17 G PO PACK: 17.0000 g | PACK | Freq: Every day | ORAL | Status: DC | PRN

## 2014-02-08 MED ORDER — GLYCOPYRROLATE 0.2 MG/ML IJ SOLN
INTRAMUSCULAR | Status: DC | PRN
  Administered 2014-02-08: .8 mg via INTRAVENOUS

## 2014-02-08 MED ORDER — ROCURONIUM BROMIDE 50 MG/5ML IV SOLN
INTRAVENOUS | Status: AC
  Filled 2014-02-08: qty 1

## 2014-02-08 MED ORDER — PROPOFOL 10 MG/ML IV BOLUS
INTRAVENOUS | Status: DC | PRN
  Administered 2014-02-08: 110 mg via INTRAVENOUS

## 2014-02-08 MED ORDER — METOCLOPRAMIDE HCL 10 MG PO TABS
5.0000 mg | ORAL_TABLET | Freq: Three times a day (TID) | ORAL | Status: DC | PRN
Start: 2014-02-08 — End: 2014-02-10

## 2014-02-08 MED ORDER — FENTANYL CITRATE 0.05 MG/ML IJ SOLN
INTRAMUSCULAR | Status: AC
  Filled 2014-02-08: qty 5

## 2014-02-08 MED ORDER — SODIUM CHLORIDE 0.9 % IR SOLN
Status: DC | PRN
  Administered 2014-02-08: 1000 mL

## 2014-02-08 MED ORDER — ALBUMIN HUMAN 5 % IV SOLN
INTRAVENOUS | Status: DC | PRN
  Administered 2014-02-08: 09:00:00 via INTRAVENOUS

## 2014-02-08 MED ORDER — DOCUSATE SODIUM 100 MG PO CAPS
100.0000 mg | ORAL_CAPSULE | Freq: Two times a day (BID) | ORAL | Status: DC
  Administered 2014-02-08 – 2014-02-09 (×2): 100 mg via ORAL
  Filled 2014-02-08 (×4): qty 1

## 2014-02-08 MED ORDER — FENTANYL CITRATE 0.05 MG/ML IJ SOLN
INTRAMUSCULAR | Status: DC | PRN
  Administered 2014-02-08 (×2): 50 ug via INTRAVENOUS
  Administered 2014-02-08: 100 ug via INTRAVENOUS
  Administered 2014-02-08: 50 ug via INTRAVENOUS

## 2014-02-08 MED ORDER — BRIMONIDINE TARTRATE 0.2 % OP SOLN
1.0000 [drp] | Freq: Two times a day (BID) | OPHTHALMIC | Status: DC
  Administered 2014-02-08 – 2014-02-10 (×4): 1 [drp] via OPHTHALMIC
  Filled 2014-02-08: qty 5

## 2014-02-08 MED ORDER — MENTHOL 3 MG MT LOZG: 1.0000 | LOZENGE | OROMUCOSAL | Status: DC | PRN

## 2014-02-08 MED ORDER — DIPHENHYDRAMINE HCL 12.5 MG/5ML PO ELIX: 12.5000 mg | ORAL_SOLUTION | ORAL | Status: DC | PRN

## 2014-02-08 MED ORDER — ACETAMINOPHEN 325 MG PO TABS
650.0000 mg | ORAL_TABLET | Freq: Four times a day (QID) | ORAL | Status: DC | PRN
  Administered 2014-02-09 (×2): 650 mg via ORAL
  Filled 2014-02-08 (×2): qty 2

## 2014-02-08 MED ORDER — SODIUM CHLORIDE 0.9 % IJ SOLN
INTRAMUSCULAR | Status: AC
  Filled 2014-02-08: qty 10

## 2014-02-08 MED ORDER — MAGNESIUM CITRATE PO SOLN: 1.0000 | Freq: Once | ORAL | Status: AC | PRN

## 2014-02-08 MED ORDER — PHENOL 1.4 % MT LIQD: 1.0000 | OROMUCOSAL | Status: DC | PRN

## 2014-02-08 MED ORDER — ONDANSETRON HCL 4 MG/2ML IJ SOLN: 4.0000 mg | Freq: Four times a day (QID) | INTRAMUSCULAR | Status: DC | PRN

## 2014-02-08 MED ORDER — PHENYLEPHRINE 40 MCG/ML (10ML) SYRINGE FOR IV PUSH (FOR BLOOD PRESSURE SUPPORT)
PREFILLED_SYRINGE | INTRAVENOUS | Status: AC
  Filled 2014-02-08: qty 10

## 2014-02-08 MED ORDER — SENNA-DOCUSATE SODIUM 8.6-50 MG PO TABS: 2.0000 | ORAL_TABLET | Freq: Every day | ORAL | Status: DC

## 2014-02-08 MED ORDER — ONDANSETRON HCL 4 MG/2ML IJ SOLN
INTRAMUSCULAR | Status: DC | PRN
  Administered 2014-02-08: 4 mg via INTRAVENOUS

## 2014-02-08 MED ORDER — ROCURONIUM BROMIDE 100 MG/10ML IV SOLN
INTRAVENOUS | Status: DC | PRN
  Administered 2014-02-08: 50 mg via INTRAVENOUS

## 2014-02-08 MED ORDER — LACTATED RINGERS IV SOLN
INTRAVENOUS | Status: DC | PRN
  Administered 2014-02-08 (×2): via INTRAVENOUS

## 2014-02-08 MED ORDER — MIDAZOLAM HCL 2 MG/2ML IJ SOLN
INTRAMUSCULAR | Status: AC
  Filled 2014-02-08: qty 2

## 2014-02-08 MED ORDER — PROPOFOL 10 MG/ML IV BOLUS
INTRAVENOUS | Status: AC
  Filled 2014-02-08: qty 20

## 2014-02-08 MED ORDER — HYDROMORPHONE HCL PF 1 MG/ML IJ SOLN
0.2500 mg | INTRAMUSCULAR | Status: DC | PRN
  Administered 2014-02-08 (×2): 0.5 mg via INTRAVENOUS

## 2014-02-08 SURGICAL SUPPLY — 58 items
APL SKNCLS STERI-STRIP NONHPOA (GAUZE/BANDAGES/DRESSINGS) ×1
BENZOIN TINCTURE PRP APPL 2/3 (GAUZE/BANDAGES/DRESSINGS) ×2 IMPLANT
BLADE SAW SAG 73X25 THK (BLADE) ×1
BLADE SAW SGTL 73X25 THK (BLADE) ×1 IMPLANT
BRUSH FEMORAL CANAL (MISCELLANEOUS) IMPLANT
CAPT HIP PF MOP ×1 IMPLANT
CLSR STERI-STRIP ANTIMIC 1/2X4 (GAUZE/BANDAGES/DRESSINGS) ×3 IMPLANT
COVER SURGICAL LIGHT HANDLE (MISCELLANEOUS) ×2 IMPLANT
DRAPE INCISE IOBAN 66X45 STRL (DRAPES) IMPLANT
DRAPE ORTHO SPLIT 77X108 STRL (DRAPES) ×4
DRAPE PROXIMA HALF (DRAPES) ×2 IMPLANT
DRAPE SURG ORHT 6 SPLT 77X108 (DRAPES) ×2 IMPLANT
DRAPE U-SHAPE 47X51 STRL (DRAPES) ×2 IMPLANT
DRILL BIT 5/64 (BIT) ×2 IMPLANT
DRSG MEPILEX BORDER 4X12 (GAUZE/BANDAGES/DRESSINGS) ×1 IMPLANT
DRSG MEPILEX BORDER 4X8 (GAUZE/BANDAGES/DRESSINGS) IMPLANT
DRSG PAD ABDOMINAL 8X10 ST (GAUZE/BANDAGES/DRESSINGS) IMPLANT
DURAPREP 26ML APPLICATOR (WOUND CARE) ×2 IMPLANT
ELECT CAUTERY BLADE 6.4 (BLADE) ×2 IMPLANT
ELECT REM PT RETURN 9FT ADLT (ELECTROSURGICAL) ×2
ELECTRODE REM PT RTRN 9FT ADLT (ELECTROSURGICAL) ×1 IMPLANT
GLOVE BIOGEL PI ORTHO PRO SZ8 (GLOVE) ×1
GLOVE ORTHO TXT STRL SZ7.5 (GLOVE) ×2 IMPLANT
GLOVE PI ORTHO PRO STRL SZ8 (GLOVE) ×1 IMPLANT
GLOVE SURG ORTHO 8.0 STRL STRW (GLOVE) ×2 IMPLANT
GOWN STRL REUS W/ TWL LRG LVL3 (GOWN DISPOSABLE) ×1 IMPLANT
GOWN STRL REUS W/TWL LRG LVL3 (GOWN DISPOSABLE) ×2
HANDPIECE INTERPULSE COAX TIP (DISPOSABLE)
HOOD PEEL AWAY FACE SHEILD DIS (HOOD) ×4 IMPLANT
KIT BASIN OR (CUSTOM PROCEDURE TRAY) ×2 IMPLANT
KIT ROOM TURNOVER OR (KITS) ×2 IMPLANT
MANIFOLD NEPTUNE II (INSTRUMENTS) ×2 IMPLANT
NDL HYPO 25GX1X1/2 BEV (NEEDLE) ×1 IMPLANT
NEEDLE HYPO 25GX1X1/2 BEV (NEEDLE) ×2 IMPLANT
NS IRRIG 1000ML POUR BTL (IV SOLUTION) ×2 IMPLANT
PACK TOTAL JOINT (CUSTOM PROCEDURE TRAY) ×2 IMPLANT
PAD ARMBOARD 7.5X6 YLW CONV (MISCELLANEOUS) ×4 IMPLANT
PILLOW ABDUCTION HIP (SOFTGOODS) ×2 IMPLANT
PRESSURIZER FEMORAL UNIV (MISCELLANEOUS) IMPLANT
RETRIEVER SUT HEWSON (MISCELLANEOUS) ×2 IMPLANT
SET HNDPC FAN SPRY TIP SCT (DISPOSABLE) IMPLANT
SPONGE GAUZE 4X4 12PLY (GAUZE/BANDAGES/DRESSINGS) ×2 IMPLANT
SPONGE LAP 4X18 X RAY DECT (DISPOSABLE) IMPLANT
SUCTION FRAZIER TIP 10 FR DISP (SUCTIONS) ×2 IMPLANT
SUT FIBERWIRE #2 38 REV NDL BL (SUTURE) ×6
SUT MNCRL AB 4-0 PS2 18 (SUTURE) ×2 IMPLANT
SUT VIC AB 0 CT1 27 (SUTURE) ×2
SUT VIC AB 0 CT1 27XBRD ANBCTR (SUTURE) ×1 IMPLANT
SUT VIC AB 2-0 CT1 27 (SUTURE) ×2
SUT VIC AB 2-0 CT1 TAPERPNT 27 (SUTURE) ×1 IMPLANT
SUT VIC AB 3-0 SH 8-18 (SUTURE) ×2 IMPLANT
SUTURE FIBERWR#2 38 REV NDL BL (SUTURE) ×3 IMPLANT
SYR CONTROL 10ML LL (SYRINGE) ×2 IMPLANT
TOWEL OR 17X24 6PK STRL BLUE (TOWEL DISPOSABLE) ×2 IMPLANT
TOWEL OR 17X26 10 PK STRL BLUE (TOWEL DISPOSABLE) ×2 IMPLANT
TOWER CARTRIDGE SMART MIX (DISPOSABLE) IMPLANT
TRAY FOLEY CATH 14FR (SET/KITS/TRAYS/PACK) IMPLANT
WATER STERILE IRR 1000ML POUR (IV SOLUTION) ×4 IMPLANT

## 2014-02-08 NOTE — Plan of Care (Signed)
Problem: Consults Goal: Diagnosis- Total Joint Replacement Primary Total Hip     

## 2014-02-08 NOTE — Preoperative (Signed)
Beta Blockers   Reason not to administer Beta Blockers:Not Applicable 

## 2014-02-08 NOTE — Progress Notes (Signed)
Utilization review completed.  

## 2014-02-08 NOTE — Discharge Instructions (Signed)
Diet: As you were doing prior to hospitalization  ° °Shower:  May shower but keep the wounds dry, use an occlusive plastic wrap, NO SOAKING IN TUB.  If the bandage gets wet, change with a clean dry gauze. ° °Dressing:  You may change your dressing 3-5 days after surgery.  Then change the dressing daily with sterile gauze dressing.   ° °There are sticky tapes (steri-strips) on your wounds and all the stitches are absorbable.  Leave the steri-strips in place when changing your dressings, they will peel off with time, usually 2-3 weeks. ° °Activity:  Increase activity slowly as tolerated, but follow the weight bearing instructions below.  No lifting or driving for 6 weeks. ° °Weight Bearing:   As tolerated.   ° °To prevent constipation: you may use a stool softener such as - ° °Colace (over the counter) 100 mg by mouth twice a day  °Drink plenty of fluids (prune juice may be helpful) and high fiber foods °Miralax (over the counter) for constipation as needed.   ° °Itching:  If you experience itching with your medications, try taking only a single pain pill, or even half a pain pill at a time.  You may take up to 10 pain pills per day, and you can also use benadryl over the counter for itching or also to help with sleep.  ° °Precautions:  If you experience chest pain or shortness of breath - call 911 immediately for transfer to the hospital emergency department!! ° °If you develop a fever greater that 101 F, purulent drainage from wound, increased redness or drainage from wound, or calf pain -- Call the office at [phone]                                                °Follow- Up Appointment:  Please call for an appointment to be seen in 2 weeks [place] - [phone] ° ° ° ° ° °

## 2014-02-08 NOTE — Op Note (Signed)
02/08/2014  9:36 AM  PATIENT:  Jonathan Daniels.   MRN: [id number]  PRE-OPERATIVE DIAGNOSIS:  Left hip osteoarthritis  POST-OPERATIVE DIAGNOSIS:  Left hip osteoarthritis  PROCEDURE:  Procedure(s): TOTAL HIP ARTHROPLASTY  PREOPERATIVE INDICATIONS:    Jonathan Daniels. is an 78 y.o. male who has a diagnosis of Primary osteoarthritis of left hip and elected for surgical management after failing conservative treatment.  The risks benefits and alternatives were discussed with the patient including but not limited to the risks of nonoperative treatment, versus surgical intervention including infection, bleeding, nerve injury, periprosthetic fracture, the need for revision surgery, dislocation, leg length discrepancy, blood clots, cardiopulmonary complications, morbidity, mortality, among others, and they were willing to proceed.     OPERATIVE REPORT     SURGEON:  Marchia Bond, MD    ASSISTANT:  Mendel Ryder in time, PA-C  and Joya Gaskins, OPA-C (Present throughout the entire procedure,  necessary for completion of procedure in a timely manner, assisting with retraction, instrumentation, and closure)     ANESTHESIA:  General    COMPLICATIONS:  None.     COMPONENTS:  [employer] fit femur size 4-high offset with a 36 mm +5 head ball and a gription acetabular shell size 52 with a 10 degree lipped polyethylene liner    PROCEDURE IN DETAIL:   The patient was met in the holding area and  identified.  The appropriate hip was identified and marked at the operative site.  The patient was then transported to the OR  and  placed under general anesthesia.  At that point, the patient was  placed in the lateral decubitus position with the operative side up and  secured to the operating room table and all bony prominences padded.     The operative lower extremity was prepped from the iliac crest to the distal leg.  Sterile draping was performed.  Time out was performed prior to incision.      A  routine posterolateral approach was utilized via sharp dissection  carried down to the subcutaneous tissue.  Gross bleeders were Bovie coagulated.  The iliotibial band was identified and incised along the length of the skin incision.  Self-retaining retractors were  inserted.  With the hip internally rotated, the short external rotators  were identified. The piriformis and capsule was tagged with FiberWire, and the hip capsule released in a T-type fashion. He was extremely tight, and access was very difficult.  The femoral neck was exposed, and I resected the femoral neck using the appropriate jig. I initially made the cut fairly high, but could not get access to the acetabulum, and went back and recut just below a thumb's breadth from the lesser trochanter.  I then exposed the deep acetabulum, cleared out any tissue including the ligamentum teres.  A wing retractor was placed.  After adequate visualization, I excised the labrum, and then sequentially reamed.  I placed the trial acetabulum, which seated nicely, and then impacted the real cup into place.  Appropriate version and inclination was confirmed clinically matching their bony anatomy, and also with the use of the jig.there was a fair amount of cystic changes within the acetabulum, which I actually did graft using morselized cancellus bone from the reamings.  A trial polyethylene liner was placed and the wing retractor removed.    I then prepared the proximal femur using the cookie-cutter, the lateralizing reamer, and then sequentially reamed and broached.  A trial broach, neck, and head was utilized, and I reduced  the hip and it was found to have excellent stability with functional range of motion. The trial components were then removed, and the real polyethylene liner was placed with the lip directed posteriorly.initially, the standard offset was not quite matching his anatomy, so I used a high offset, which restored leg length, offset, and soft  tissue tension were appropriately.  I then impacted the real femoral prosthesis into place into the appropriate version, slightly anteverted to the normal anatomy, and I impacted the real head ball into place. The hip was then reduced and taken through functional range of motion and found to have excellent stability. Leg lengths were restored.  I then used a 2 mm drill bits to pass the FiberWire suture from the capsule and piriformis through the greater trochanter, and secured this. Excellent posterior capsular repair was achieved. I also closed the T in the capsule.  I then irrigated the hip copiously again with pulse lavage, and repaired the fascia with Vicryl, followed by Vicryl for the subcutaneous tissue, Monocryl for the skin, Steri-Strips and sterile gauze. The wounds were injected. The patient was then awakened and returned to PACU in stable and satisfactory condition. There were no complications.  Marchia Bond, MD Orthopedic Surgeon [phone]   02/08/2014 9:36 AM

## 2014-02-08 NOTE — H&P (Signed)
PREOPERATIVE H&P  Chief Complaint: djd left hip  HPI: Jonathan FergusonJames Louro Jr. is a 78 y.o. male who presents for preoperative history and physical with a diagnosis of djd left hip. Symptoms are rated as moderate to severe, and have been worsening.  This is significantly impairing activities of daily living.  He has elected for surgical management. Lost motion, failed nsaids, use of a cane, and all other conservative rx.  Past Medical History  Diagnosis Date  . GERD (gastroesophageal reflux disease)   . BPH (benign prostatic hyperplasia)     but not on any meds  . Hyperlipidemia     was on med but has been off for a yr-medical md is aware  . Hypertension     was on med but has been off a yr-medical md is aware  . Arthritis   . Joint swelling   . Urinary frequency   . Diabetes mellitus     only takes Cinnamon  . Glaucoma     uses eye drops daily   Past Surgical History  Procedure Laterality Date  . Rotator cuff repair Right   . Patella tendons broke Bilateral   . Shoulder arthroscopy with biceps tendon repair Right   . Cardiac catheterization  2011  . Colonoscopy     History   Social History  . Marital Status: Married    Spouse Name: N/A    Number of Children: N/A  . Years of Education: N/A   Social History Main Topics  . Smoking status: Never Smoker   . Smokeless tobacco: Never Used  . Alcohol Use: No  . Drug Use: No  . Sexual Activity: Yes   Other Topics Concern  . None   Social History Narrative  . None   Family History  Problem Relation Age of Onset  . Kidney disease Other   . Diabetes Father   . Cancer Neg Hx   . Early death Neg Hx   . Heart disease Neg Hx   . Hypertension Neg Hx   . Hyperlipidemia Neg Hx   . Stroke Neg Hx    No Known Allergies Prior to Admission medications   Medication Sig Start Date End Date Taking? Authorizing Provider  acetaminophen (TYLENOL) 325 MG tablet Take 650 mg by mouth every 6 (six) hours as needed for mild pain.   Yes  Historical Provider, MD  aspirin 81 MG tablet Take 81 mg by mouth daily.     Yes Historical Provider, MD  bimatoprost (LUMIGAN) 0.03 % ophthalmic solution Place 1 drop into both eyes at bedtime.   Yes Historical Provider, MD  brimonidine-timolol (COMBIGAN) 0.2-0.5 % ophthalmic solution Place 1 drop into both eyes every 12 (twelve) hours.   Yes Historical Provider, MD  CINNAMON PO Take 2 capsules by mouth daily.   Yes Historical Provider, MD  Glucosamine-Chondroitin (GLUCOSAMINE CHONDR COMPLEX PO) Take by mouth.     Yes Historical Provider, MD  meloxicam (MOBIC) 7.5 MG tablet Take 7.5 mg by mouth daily.   Yes Historical Provider, MD  Multiple Vitamin (MULTIVITAMIN) tablet Take 1 tablet by mouth daily.     Yes Historical Provider, MD  Omega-3 Fatty Acids (FISH OIL) 1000 MG CAPS Take 1 capsule by mouth daily.    Yes Historical Provider, MD     Positive ROS: All other systems have been reviewed and were otherwise negative with the exception of those mentioned in the HPI and as above.  Physical Exam: General: Alert, no acute distress Cardiovascular: No pedal  edema Respiratory: No cyanosis, no use of accessory musculature GI: No organomegaly, abdomen is soft and non-tender Skin: No lesions in the area of chief complaint Neurologic: Sensation intact distally Psychiatric: Patient is competent for consent with normal mood and affect Lymphatic: No axillary or cervical lymphadenopathy  MUSCULOSKELETAL: left hip AROM 0-80 deg, 0 deg IR, EHL firing.  Assessment: djd left hip  Plan: Plan for Procedure(s): TOTAL HIP ARTHROPLASTY  The risks benefits and alternatives were discussed with the patient including but not limited to the risks of nonoperative treatment, versus surgical intervention including infection, bleeding, nerve injury, periprosthetic fracture, the need for revision surgery, dislocation, leg length discrepancy, blood clots, cardiopulmonary complications, morbidity, mortality, among  others, and they were willing to proceed.     Eulas Post, MD Cell [phone]   02/08/2014 7:19 AM

## 2014-02-08 NOTE — Anesthesia Postprocedure Evaluation (Signed)
  Anesthesia Post-op Note  Patient: Jonathan FergusonJames Tiu Jr.  Procedure(s) Performed: Procedure(s): TOTAL HIP ARTHROPLASTY (Left)  Patient Location: PACU  Anesthesia Type:General  Level of Consciousness: awake and alert   Airway and Oxygen Therapy: Patient Spontanous Breathing  Post-op Pain: mild  Post-op Assessment: Post-op Vital signs reviewed, Patient's Cardiovascular Status Stable and Respiratory Function Stable  Post-op Vital Signs: Reviewed  Filed Vitals:   02/08/14 1108  BP: 132/61  Pulse: 66  Temp:   Resp: 12    Complications: No apparent anesthesia complications

## 2014-02-08 NOTE — Evaluation (Signed)
Occupational Therapy Evaluation Patient Details Name: Jonathan Daniels. MRN: [id number] DOB: Sep 22, 1936 Today's Date: 02/08/2014    History of Present Illness 78 y.o. s/p TOTAL HIP ARTHROPLASTY (Left)   Clinical Impression   Pt presents with below problem list. Pt independent with ADLs, PTA. Feel pt will benefit from acute OT to increase independence prior to d/c.     Follow Up Recommendations  No OT follow up;Supervision - Intermittent (when OOB/mobility)    Equipment Recommendations  Other (comment) (AE)    Recommendations for Other Services       Precautions / Restrictions Precautions Precautions: Posterior Hip;Fall Precaution Booklet Issued: No Precaution Comments: Reviewed precautions with pt and wife present Required Braces or Orthoses:  (abductor pillow) Restrictions Weight Bearing Restrictions: Yes LLE Weight Bearing: Weight bearing as tolerated      Mobility Bed Mobility Overal bed mobility: Needs Assistance Bed Mobility: Supine to Sit     Supine to sit: Min assist     General bed mobility comments: Assistance with LLE.  Transfers Overall transfer level: Needs assistance Equipment used: Rolling walker (2 wheeled) Transfers: Sit to/from Stand Sit to Stand: Min assist         General transfer comment: Cues for technique.    Balance                                    ADL       Upper Body Dressing : Set up;Supervision/safety;Sitting   Lower Body Dressing: Minimal assistance;Sit to/from stand;With adaptive equipment Toilet Transfer: Minimal assistance;Ambulation (chair)     Functional mobility during ADLs: Minimal assistance;Rolling walker General ADL Comments: Educated on AE for LB ADLs and pt practiced with reacher and sockaid. Educated on dressing technique. Recommended sitting for bathing and dressing.  Pt did ambulate a short distance in room and appeared to have near syncopal episode, so reclined in chair with feet elevated and  BP taken.     Vision                     Perception     Praxis      Pertinent Vitals/Pain Pain indicated, but not rated. Repositioned. BP 90/45 once reclined back in chair-nurse in room when taken.      Hand Dominance Right   Extremity/Trunk Assessment Upper Extremity Assessment Upper Extremity Assessment: Overall WFL for tasks assessed   Lower Extremity Assessment Lower Extremity Assessment: Defer to PT evaluation       Communication Communication Communication: HOH   Cognition Arousal/Alertness: Awake/alert Behavior During Therapy: WFL for tasks assessed/performed Overall Cognitive Status: Within Functional Limits for tasks assessed                     General Comments       Exercises      Home Living Family/patient expects to be discharged to:: Private residence Living Arrangements: Spouse/significant other Available Help at Discharge: Family Type of Home: House Home Access: Stairs to enter [employer] of Steps: 2-3 Entrance Stairs-Rails: Left Home Layout: Two level;Able to live on main level with bedroom/bathroom     Bathroom Shower/Tub: Walk-in shower;Door   [employer] Toilet: Standard Bathroom Accessibility: Yes How Accessible: Accessible via walker Home Equipment: Bedside commode;Walker - 2 wheels;Wheelchair - manual          Prior Functioning/Environment Level of Independence: Independent with assistive device(s)  Comments: using a stick to walk at times    OT Diagnosis:  Acute Pain    OT Problem List: Decreased strength;Decreased activity tolerance;Decreased knowledge of use of DME or AE;Decreased knowledge of precautions;Pain   OT Treatment/Interventions: Self-care/ADL training;DME and/or AE instruction;Therapeutic activities;Patient/family education;Balance training    OT Goals(Current goals can be found in the care plan section) Acute Rehab OT Goals Patient Stated Goal: not stated OT Goal  Formulation: With patient/family Time For Goal Achievement: 02/15/14 Potential to Achieve Goals: Good ADL Goals Pt Will Perform Lower Body Bathing: with supervision;with adaptive equipment;sit to/from stand Pt Will Perform Lower Body Dressing: with supervision;with adaptive equipment;sit to/from stand Pt Will Transfer to Toilet: with supervision;ambulating (3 in 1 over toilet) Pt Will Perform Toileting - Clothing Manipulation and hygiene: with supervision;sit to/from stand Pt Will Perform Tub/Shower Transfer: Shower transfer;with supervision;ambulating;rolling walker (shower equipment tbd) Additional ADL Goal #1: Pt will independently verbalize and demonstrate 3/3 hip precautions.    OT Frequency: Min 2X/week   Barriers to D/C:            End of Session: Equipment Utilized During Treatment: Gait belt;Rolling walker  Activity Tolerance: Patient tolerated treatment well (near syncopal episode, but otherwise did well) Patient left: in chair;with call bell/phone within reach;with family/visitor present   Time: 1701-1730 OT Time Calculation (min): 29 min Charges:  OT General Charges $OT Visit: 1 Procedure OT Evaluation $Initial OT Evaluation Tier I: 1 Procedure OT Treatments $Self Care/Home Management : 8-22 mins G-CodesEarlie Raveling:    Zareena Willis L OTR/L [phone] 02/08/2014, 5:46 PM

## 2014-02-08 NOTE — Anesthesia Procedure Notes (Signed)
Procedure Name: Intubation Date/Time: 02/08/2014 7:38 AM Performed by: Rogelia BogaMUELLER, Shawntee Mainwaring P Pre-anesthesia Checklist: Patient identified, Emergency Drugs available, Suction available, Patient being monitored and Timeout performed Patient Re-evaluated:Patient Re-evaluated prior to inductionOxygen Delivery Method: Circle system utilized Preoxygenation: Pre-oxygenation with 100% oxygen Intubation Type: IV induction Ventilation: Mask ventilation without difficulty and Oral airway inserted - appropriate to patient size Laryngoscope Size: Mac and 4 Grade View: Grade I Tube type: Oral Tube size: 7.5 mm Number of attempts: 1 Airway Equipment and Method: Stylet Placement Confirmation: ETT inserted through vocal cords under direct vision,  positive ETCO2 and breath sounds checked- equal and bilateral Secured at: 22 cm Tube secured with: Tape Dental Injury: Teeth and Oropharynx as per pre-operative assessment

## 2014-02-08 NOTE — Anesthesia Preprocedure Evaluation (Addendum)
Anesthesia Evaluation  Patient identified by MRN, date of birth, ID band Patient awake    Reviewed: Allergy & Precautions, H&P , NPO status , Patient's Chart, lab work & pertinent test results  Airway Mallampati: II TM Distance: >3 FB Neck ROM: Full    Dental no notable dental hx. (+) Teeth Intact, Poor Dentition, Dental Advisory Given   Pulmonary neg pulmonary ROS,  breath sounds clear to auscultation  Pulmonary exam normal       Cardiovascular hypertension, Pt. on medications Rhythm:Regular Rate:Normal     Neuro/Psych negative neurological ROS  negative psych ROS   GI/Hepatic Neg liver ROS, GERD-  Controlled,  Endo/Other  diabetes, Well Controlled, Type 2  Renal/GU negative Renal ROS  negative genitourinary   Musculoskeletal   Abdominal   Peds  Hematology negative hematology ROS (+)   Anesthesia Other Findings   Reproductive/Obstetrics negative OB ROS                         Anesthesia Physical Anesthesia Plan  ASA: II  Anesthesia Plan: General   Post-op Pain Management:    Induction: Intravenous  Airway Management Planned: Oral ETT  Additional Equipment:   Intra-op Plan:   Post-operative Plan: Extubation in OR  Informed Consent: I have reviewed the patients History and Physical, chart, labs and discussed the procedure including the risks, benefits and alternatives for the proposed anesthesia with the patient or authorized representative who has indicated his/her understanding and acceptance.   Dental advisory given  Plan Discussed with: CRNA, Anesthesiologist and Surgeon  Anesthesia Plan Comments:        Anesthesia Quick Evaluation

## 2014-02-08 NOTE — Transfer of Care (Signed)
Immediate Anesthesia Transfer of Care Note  Patient: Jonathan FergusonJames Dockter Jr.  Procedure(s) Performed: Procedure(s): TOTAL HIP ARTHROPLASTY (Left)  Patient Location: PACU  Anesthesia Type:General  Level of Consciousness: awake and patient cooperative  Airway & Oxygen Therapy: Patient Spontanous Breathing and Patient connected to nasal cannula oxygen  Post-op Assessment: Report given to PACU RN and Patient moving all extremities X 4  Post vital signs: Reviewed and stable  Complications: No apparent anesthesia complications

## 2014-02-09 ENCOUNTER — Encounter (HOSPITAL_COMMUNITY): Payer: Self-pay | Admitting: General Practice

## 2014-02-09 LAB — CBC
HCT: 27.9 % — ABNORMAL LOW (ref 39.0–52.0)
Hemoglobin: 9.8 g/dL — ABNORMAL LOW (ref 13.0–17.0)
MCH: 30 pg (ref 26.0–34.0)
MCHC: 35.1 g/dL (ref 30.0–36.0)
MCV: 85.3 fL (ref 78.0–100.0)
Platelets: 133 10*3/uL — ABNORMAL LOW (ref 150–400)
RBC: 3.27 MIL/uL — ABNORMAL LOW (ref 4.22–5.81)
RDW: 13.2 % (ref 11.5–15.5)
WBC: 10.6 10*3/uL — AB (ref 4.0–10.5)

## 2014-02-09 LAB — GLUCOSE, CAPILLARY
GLUCOSE-CAPILLARY: 161 mg/dL — AB (ref 70–99)
GLUCOSE-CAPILLARY: 140 mg/dL — AB (ref 70–99)
Glucose-Capillary: 141 mg/dL — ABNORMAL HIGH (ref 70–99)
Glucose-Capillary: 157 mg/dL — ABNORMAL HIGH (ref 70–99)

## 2014-02-09 LAB — BASIC METABOLIC PANEL
BUN: 17 mg/dL (ref 6–23)
CHLORIDE: 102 meq/L (ref 96–112)
CO2: 22 mEq/L (ref 19–32)
Calcium: 8.4 mg/dL (ref 8.4–10.5)
Creatinine, Ser: 1.23 mg/dL (ref 0.50–1.35)
GFR calc non Af Amer: 55 mL/min — ABNORMAL LOW (ref >90)
GFR, EST AFRICAN AMERICAN: 64 mL/min — AB (ref >90)
Glucose, Bld: 197 mg/dL — ABNORMAL HIGH (ref 70–99)
Potassium: 4.3 mEq/L (ref 3.7–5.3)
Sodium: 137 mEq/L (ref 137–147)

## 2014-02-09 LAB — HEMOGLOBIN AND HEMATOCRIT, BLOOD
HCT: 27.8 % — ABNORMAL LOW (ref 39.0–52.0)
Hemoglobin: 9.8 g/dL — ABNORMAL LOW (ref 13.0–17.0)

## 2014-02-09 MED ORDER — SODIUM CHLORIDE 0.9 % IV BOLUS (SEPSIS)
500.0000 mL | Freq: Once | INTRAVENOUS | Status: AC
  Administered 2014-02-09: 500 mL via INTRAVENOUS

## 2014-02-09 MED ORDER — KETOROLAC TROMETHAMINE 15 MG/ML IJ SOLN
INTRAMUSCULAR | Status: AC
  Filled 2014-02-09: qty 1

## 2014-02-09 MED ORDER — KETOROLAC TROMETHAMINE 15 MG/ML IJ SOLN
INTRAMUSCULAR | Status: AC
  Administered 2014-02-09: 7.5 mg
  Filled 2014-02-09: qty 1

## 2014-02-09 NOTE — Progress Notes (Signed)
Orthopedic Tech Progress Note Patient Details:  Jonathan FergusonJames Gibbins Jr. 09/20/1936 [id number]   Trapeze bar   [name], Mickie BailJennifer Carol 02/09/2014, 9:07 AM

## 2014-02-09 NOTE — Progress Notes (Signed)
Physical Therapy Treatment Patient Details Name: Jonathan FergusonJames Reczek Daniels. MRN: [id number] DOB: 04/26/1936 Today's Date: 02/09/2014    History of Present Illness 78 y.o. s/p TOTAL HIP ARTHROPLASTY (Left)    PT Comments    Pt progressing well towards physical therapy goals, ambulating up to 100 feet with min guard, and completed stair training with min guard. Pt verbalizes hip precautions correctly. PT will continue to follow until d/c; will benefit from skilled PT in home setting upon D/c to improve functional independence.   Follow Up Recommendations  Home health PT;Supervision - Intermittent     Equipment Recommendations  None recommended by PT    Recommendations for Other Services OT consult     Precautions / Restrictions Precautions Precautions: Posterior Hip;Fall Precaution Comments: Pt able to state 3/3 precautions Required Braces or Orthoses: Other Brace/Splint (abductor pillow) Restrictions Weight Bearing Restrictions: Yes LLE Weight Bearing: Weight bearing as tolerated    Mobility  Bed Mobility               General bed mobility comments: pt in recliner  Transfers Overall transfer level: Needs assistance Equipment used: Rolling walker (2 wheeled) Transfers: Sit to/from Stand Sit to Stand: Min assist         General transfer comment: Min assist to rise, cues for hand placement.  Performed several sit<>stands from chair. Safely following hip precautions  Ambulation/Gait Ambulation/Gait assistance: Min guard Ambulation Distance (Feet): 100 Feet Assistive device: Rolling walker (2 wheeled) Gait Pattern/deviations: Step-to pattern;Step-through pattern;Decreased stance time - right;Antalgic;Trunk flexed Gait velocity: decreased   General Gait Details: Pt progressing well with ambulation, min assist for safety, improving step-through technique with intermittent step-to pattern. Verbal cues for upright posture and forward gaze.   Stairs Stairs: Yes Stairs  assistance: Min guard Stair Management: One rail Left;Step to pattern;Sideways Number of Stairs: 3 General stair comments: pt safely navigates 3 steps using one rail on left and sidways approach. Min guard for safety with cues and demonstration of proper sequencing/technique.  Wheelchair Mobility    Modified Rankin (Stroke Patients Only)       Balance                                    Cognition Arousal/Alertness: Awake/alert Behavior During Therapy: WFL for tasks assessed/performed Overall Cognitive Status: Within Functional Limits for tasks assessed                      Exercises General Exercises - Lower Extremity Ankle Circles/Pumps: AROM;Both;10 reps;Seated Quad Sets: AROM;Strengthening;Both;10 reps;Supine Straight Leg Raises: AAROM;Strengthening;Left;10 reps;Supine    General Comments        Pertinent Vitals/Pain 4/10 pain - states he does not want strong pain killers Nurse notified Pt repositioned in chair for comfort    Home Living                      Prior Function            PT Goals (current goals can now be found in the care plan section) Acute Rehab PT Goals Patient Stated Goal: Go home as soon as possible PT Goal Formulation: With patient Time For Goal Achievement: 02/16/14 Potential to Achieve Goals: Good Progress towards PT goals: Progressing toward goals    Frequency  7X/week    PT Plan Current plan remains appropriate    Co-evaluation  End of Session Equipment Utilized During Treatment: Gait belt Activity Tolerance: Patient tolerated treatment well Patient left: in chair;with call bell/phone within reach     Time: 1357-1423 PT Time Calculation (min): 26 min  Charges:  $Gait Training: 8-22 mins $Therapeutic Exercise: 8-22 mins                    G Codes:      [employer], [place] [phone]  Berton Mount 02/09/2014, 3:05 PM

## 2014-02-09 NOTE — Care Management Note (Signed)
CARE MANAGEMENT NOTE 02/09/2014  Patient:  Jonathan Daniels,Jonathan Daniels   Account Number:  [id number]  Date Initiated:  02/09/2014  Documentation initiated by:  Vance PeperBRADY,Andilynn Delavega  Subjective/Objective Assessment:   78 yr old male s/p left total hip arthroplasty.     Action/Plan:   [facility], no changes. patient has family support at discharge.   Anticipated DC Date:  02/10/2014   Anticipated DC Plan:  HOME W HOME HEALTH SERVICES      DC Planning Services  CM consult      [facility]  HOME HEALTH  DURABLE MEDICAL EQUIPMENT   Choice offered to / List presented to:  C-1 Patient   DME arranged  [facility]  3-N-1      DME agency  [employer]     HH arranged  HH-2 PT      HH [facility]   Status of service:  Completed, signed off Medicare Important Message given?   (If response is "NO", the following Medicare IM given date fields will be blank) Date Medicare IM given:   Date Additional Medicare IM given:    Discharge Disposition:  HOME W HOME HEALTH SERVICES

## 2014-02-09 NOTE — Evaluation (Signed)
Physical Therapy Evaluation Patient Details Name: Jonathan FergusonJames Weinheimer Jr. MRN: [id number] DOB: 08/03/1936 Today's Date: 02/09/2014   History of Present Illness  78 y.o. s/p TOTAL HIP ARTHROPLASTY (Left)  Clinical Impression  Pt is s/p Left THA resulting in the deficits listed below (see PT Problem List).  Pt will benefit from skilled PT to increase their independence and safety with mobility to allow discharge to the venue listed below. PT plans to practice stair training with patient this afternoon. Nurse notified of blood found on bed pad and in urinal.     Follow Up Recommendations Home health PT;Supervision - Intermittent    Equipment Recommendations  None recommended by PT    Recommendations for Other Services OT consult     Precautions / Restrictions Precautions Precautions: Posterior Hip;Fall Precaution Booklet Issued: Yes (comment) Precaution Comments: Pt able to state 3/3 precautions Required Braces or Orthoses: Other Brace/Splint (abductor pillow) Restrictions Weight Bearing Restrictions: Yes LLE Weight Bearing: Weight bearing as tolerated      Mobility  Bed Mobility Overal bed mobility: Needs Assistance Bed Mobility: Supine to Sit     Supine to sit: Min guard     General bed mobility comments: pt in recliner  Transfers Overall transfer level: Needs assistance Equipment used: Rolling walker (2 wheeled) Transfers: Sit to/from Stand Sit to Stand: Min guard         General transfer comment: Min assist to rise, cues for hand placement. second person available for safety  Ambulation/Gait Ambulation/Gait assistance: Min guard Ambulation Distance (Feet): 100 Feet Assistive device: Rolling walker (2 wheeled) Gait Pattern/deviations: Step-to pattern;Step-through pattern;Decreased stance time - right;Antalgic;Trunk flexed Gait velocity: decreased   General Gait Details: Pt ambulates generally well, Increased gait speed towards end of distance. Min guard for safety,  second person followed with chair but was not needed. Verbal cues to increase stance time on R. Demonstrates correct sequencing.  Stairs            Wheelchair Mobility    Modified Rankin (Stroke Patients Only)       Balance Overall balance assessment: Needs assistance Sitting-balance support: Single extremity supported Sitting balance-Leahy Scale: Fair Sitting balance - Comments: Able to sit EOB without assist   Standing balance support: Bilateral upper extremity supported Standing balance-Leahy Scale: Poor Standing balance comment: Requires assist from RW to stand                     Pertinent Vitals/Pain Pain 2/10 Pt repositioned in chair for comfort.   Vitals:  Supine - BP @ 104/52 - HR 80 Sitting - BP @ 123/59 - HR 87 Standing - BP @ 144/55 - HR 95      Home Living Family/patient expects to be discharged to:: Private residence Living Arrangements: Spouse/significant other Available Help at Discharge: Available 24 hours/day Type of Home: House Home Access: Stairs to enter Entrance Stairs-Rails: Left Entrance Stairs-Number of Steps: 2-3 Home Layout: Two level;Able to live on main level with bedroom/bathroom Home Equipment: Bedside commode;Walker - 2 wheels;Wheelchair - manual      Prior Function Level of Independence: Independent with assistive device(s)         Comments: using a stick to walk at times     Hand Dominance   Dominant Hand: Right    Extremity/Trunk Assessment   Upper Extremity Assessment: Overall WFL for tasks assessed           Lower Extremity Assessment: LLE deficits/detail   LLE Deficits / Details: 3/5 knee  extension 2+/5 hip flexion     Communication   Communication: HOH  Cognition Arousal/Alertness: Awake/alert Behavior During Therapy: WFL for tasks assessed/performed Overall Cognitive Status: Within Functional Limits for tasks assessed                      General Comments General comments  (skin integrity, edema, etc.): Blood noticed on bed pad. Pt urinated into urinal at bedside; noticable amount of blood on hands and in/around urnial upon finishing, states it "burns" when he urinates. PT examined scrotal region and did not notice any lacerations. Blood appears to be coming out of urethra when he urinates, but some is obviously leaking. Nurse notified via telephone.    Exercises General Exercises - Lower Extremity Ankle Circles/Pumps: AROM;Both;10 reps;Seated      Assessment/Plan    PT Assessment Patient needs continued PT services  PT Diagnosis Difficulty walking;Abnormality of gait;Acute pain   PT Problem List Decreased strength;Decreased range of motion;Decreased activity tolerance;Decreased balance;Decreased mobility;Decreased coordination;Decreased knowledge of use of DME;Decreased knowledge of precautions;Pain  PT Treatment Interventions DME instruction;Gait training;Stair training;Functional mobility training;Therapeutic activities;Therapeutic exercise;Balance training;Neuromuscular re-education;Modalities   PT Goals (Current goals can be found in the Care Plan section) Acute Rehab PT Goals Patient Stated Goal: not stated PT Goal Formulation: With patient Time For Goal Achievement: 02/16/14 Potential to Achieve Goals: Good    Frequency 7X/week   Barriers to discharge        End of Session Equipment Utilized During Treatment: Gait belt Activity Tolerance: Patient tolerated treatment well Patient left: in chair;with call bell/phone within reach         Time: 1003-1054 PT Time Calculation (min): 51 min   Charges:   PT Evaluation $Initial PT Evaluation Tier I: 1 Procedure PT Treatments $Gait Training: 8-22 mins $Self Care/Home Management: 23-37   PT G Codes:         Charlsie Merles, PT [phone]  Berton Mount 02/09/2014, 1:02 PM

## 2014-02-09 NOTE — Progress Notes (Signed)
BP 86/36 manually.  Patient reports "tired."  Denies dizziness.  EBL .  Dressing to left hip intact with few small stains.  Post-op Hgb at 1630 was 12.6.   On-call notified and orders received for NS 500cc bolus and STAT H/H, call results.  Patient educated.  Continue to monitor.

## 2014-02-09 NOTE — Progress Notes (Addendum)
Occupational Therapy Treatment Patient Details Name: Jonathan FergusonJames Wishart Jr. MRN: [id number] DOB: 03/05/1936 Today's Date: 02/09/2014    History of present illness 78 y.o. s/p TOTAL HIP ARTHROPLASTY (Left)   OT comments  Progressing towards goals. Education provided during session. Practiced shower transfer.  Follow Up Recommendations  No OT follow up;Supervision - Intermittent (when OOB/mobility)    Equipment Recommendations  Other (comment) (AE)    Recommendations for Other Services      Precautions / Restrictions Precautions Precautions: Posterior Hip;Fall Precaution Booklet Issued: Yes (comment) Precaution Comments: Pt able to state 3/3 precautions Required Braces or Orthoses: Other Brace/Splint (abductor pillow) Restrictions Weight Bearing Restrictions: Yes LLE Weight Bearing: Weight bearing as tolerated       Mobility Bed Mobility               General bed mobility comments: pt in recliner  Transfers Overall transfer level: Needs assistance Equipment used: Rolling walker (2 wheeled) Transfers: Sit to/from Stand Sit to Stand: Min guard              Balance                                   ADL             Toilet Transfer: Min guard;Ambulation;RW (chair)   Tub/ Shower Transfer: Min guard;Ambulation;Rolling walker Functional mobility during ADLs: Min guard;Rolling walker General ADL Comments: Educated on shower transfer technique and pt practiced. Recommended spouse be with him for shower transfer and for bathing. Reviewed use of reacher and could keep it on walker. Discussed rugs in house and having someone pick them up (non skid in bathroom). Educated on dressing technique.  Pt states he feels good about AE for LB ADLs. Told pt how he could use 3 in 1 as shower chair.      Vision                     Perception     Praxis      Cognition   Behavior During Therapy: WFL for tasks assessed/performed Overall Cognitive Status:  Within Functional Limits for tasks assessed                       Extremity/Trunk Assessment  Upper Extremity Assessment Upper Extremity Assessment: Overall WFL for tasks assessed   Lower Extremity Assessment Lower Extremity Assessment: LLE deficits/detail LLE Deficits / Details: 3/5 knee extension 2+/5 hip flexion LLE: Unable to fully assess due to pain LLE Sensation:  (normal)        Exercises       General Comments      Pertinent Vitals/ Pain       Pain 1.5/10. Increased activity during session.    Home Living Family/patient expects to be discharged to:: Private residence Living Arrangements: Spouse/significant other Available Help at Discharge: Available 24 hours/day Type of Home: House Home Access: Stairs to enter Enter[employer] CorporationEntrance Stairs-Number of Steps: 2-3 Entrance Stairs-Rails: Left Home Layout: Two level;Able to live on main level with bedroom/bathroom               Home Equipment: Bedside commode;Walker - 2 wheels;Wheelchair - manual          Prior Functioning/Environment Level of Independence: Independent with assistive device(s)        Comments: using a stick to walk at times   Frequency Min 2X/week  Progress Toward Goals  OT Goals(current goals can now be found in the care plan section)  Progress towards OT goals: Progressing toward goals  Acute Rehab OT Goals Patient Stated Goal: not stated OT Goal Formulation: With patient/family Time For Goal Achievement: 02/15/14 Potential to Achieve Goals: Good ADL Goals Pt Will Perform Lower Body Bathing: with supervision;with adaptive equipment;sit to/from stand Pt Will Perform Lower Body Dressing: with supervision;with adaptive equipment;sit to/from stand Pt Will Transfer to Toilet: with supervision;ambulating (3 in 1 over toilet) Pt Will Perform Toileting - Clothing Manipulation and hygiene: with supervision;sit to/from stand Pt Will Perform Tub/Shower Transfer: Shower transfer;with  supervision;ambulating;rolling walker Additional ADL Goal #1: Pt will independently verbalize and demonstrate 3/3 hip precautions.    Plan Discharge plan remains appropriate    End of Session Equipment Utilized During Treatment: Gait belt;Rolling walker (placed back on O2 at end of session)  Activity Tolerance Patient tolerated treatment well   Patient Left in chair;with call bell/phone within reach   Nurse Communication Other (comment) (O2 machine)        Time: 1610-9604 OT Time Calculation (min): 17 min  Charges: OT General Charges $OT Visit: 1 Procedure OT Treatments $Self Care/Home Management : 8-22 mins  Earlie Raveling OTR/L [phone] 02/09/2014, 12:52 PM

## 2014-02-09 NOTE — Plan of Care (Signed)
Problem: Consults Goal: Diagnosis- Total Joint Replacement Outcome: Completed/Met Date Met:  02/09/14 Primary Total Hip

## 2014-02-09 NOTE — Progress Notes (Addendum)
Patient ID: Alfredia FergusonJames Egnew Jr., male   DOB: 07/27/1936, 78 y.o.   MRN: [id number]     Subjective:  Patient reports pain as mild.  Patient states that he is feeling some better.  Had BP issues during the night and received two fluid boluses.  Objective:   VITALS:   Filed Vitals:   02/09/14 0345 02/09/14 0400 02/09/14 0444 02/09/14 0630  BP: 113/52  104/48 95/48  Pulse: 88  89   Temp: 99 F (37.2 C)  98.7 F (37.1 C)   TempSrc:      Resp: 18 18 18    Weight:      SpO2: 97% 99% 96%     ABD soft Sensation intact distally Dorsiflexion/Plantar flexion intact Incision: dressing C/D/I and no drainage Foot and ankle sensation intact full motion of foot and ankle  Lab Results  Component Value Date   WBC 10.6* 02/09/2014   HGB 9.8* 02/09/2014   HGB 9.8* 02/09/2014   HCT 27.9* 02/09/2014   HCT 27.8* 02/09/2014   MCV 85.3 02/09/2014   PLT 133* 02/09/2014     Assessment/Plan: 1 Day Post-Op   Principal Problem:   Primary osteoarthritis of left hip Active Problems:   Hip arthritis   Advance diet Up with therapy ABLA continue to monitor WBAT Continue to monitor BP May plan for DC tomorrow   Haskel KhanDOUGLAS PARRY, BRANDON 02/09/2014, 8:30 AM  Seen and agree with above.  Teryl LucyJoshua Eland Lamantia, MD Cell [phone]

## 2014-02-10 LAB — GLUCOSE, CAPILLARY: GLUCOSE-CAPILLARY: 153 mg/dL — AB (ref 70–99)

## 2014-02-10 LAB — BASIC METABOLIC PANEL
BUN: 17 mg/dL (ref 6–23)
CHLORIDE: 107 meq/L (ref 96–112)
CO2: 23 meq/L (ref 19–32)
Calcium: 8.2 mg/dL — ABNORMAL LOW (ref 8.4–10.5)
Creatinine, Ser: 1.07 mg/dL (ref 0.50–1.35)
GFR calc non Af Amer: 65 mL/min — ABNORMAL LOW (ref >90)
GFR, EST AFRICAN AMERICAN: 76 mL/min — AB (ref >90)
Glucose, Bld: 140 mg/dL — ABNORMAL HIGH (ref 70–99)
POTASSIUM: 4 meq/L (ref 3.7–5.3)
Sodium: 143 mEq/L (ref 137–147)

## 2014-02-10 LAB — CBC
HCT: 25.8 % — ABNORMAL LOW (ref 39.0–52.0)
Hemoglobin: 9.1 g/dL — ABNORMAL LOW (ref 13.0–17.0)
MCH: 29.9 pg (ref 26.0–34.0)
MCHC: 35.3 g/dL (ref 30.0–36.0)
MCV: 84.9 fL (ref 78.0–100.0)
PLATELETS: 128 10*3/uL — AB (ref 150–400)
RBC: 3.04 MIL/uL — AB (ref 4.22–5.81)
RDW: 13.7 % (ref 11.5–15.5)
WBC: 13.9 10*3/uL — ABNORMAL HIGH (ref 4.0–10.5)

## 2014-02-10 MED ORDER — RIVAROXABAN 10 MG PO TABS
10.0000 mg | ORAL_TABLET | Freq: Every day | ORAL | Status: DC
  Administered 2014-02-10: 10 mg via ORAL
  Filled 2014-02-10: qty 1

## 2014-02-10 MED ORDER — RIVAROXABAN 10 MG PO TABS: 10.0000 mg | ORAL_TABLET | Freq: Every day | ORAL | Status: DC

## 2014-02-10 NOTE — Progress Notes (Signed)
Patient discharged to home accompanied by family. Discharge instructions and rx given and explained and patient stated understanding. IV was removed and patient left unit in a stable condition with all personal belongings via wheelchair.  

## 2014-02-10 NOTE — Progress Notes (Signed)
Physical Therapy Treatment Patient Details Name: Alfredia FergusonJames Slayden Jr. MRN: [id number] DOB: 05/25/1936 Today's Date: 02/10/2014    History of Present Illness 78 y.o. s/p TOTAL HIP ARTHROPLASTY (Left)    PT Comments    Pt progressing well towards physical therapy goals, safely ambulating up to 150 feet with supervision. Pt declines practicing stair negotiation today but demonstrated safety and understanding of technique yesterday. Pt adequate for discharge home from PT standpoint. Will continue to benefit from skilled physical therapy in home setting in order to improve independence with functional mobility.  Follow Up Recommendations  Home health PT;Supervision - Intermittent     Equipment Recommendations  None recommended by PT    Recommendations for Other Services OT consult     Precautions / Restrictions Precautions Precautions: Posterior Hip;Fall Required Braces or Orthoses: Other Brace/Splint (abductor pillow) Other Brace/Splint: abductor pillow Restrictions Weight Bearing Restrictions: Yes LLE Weight Bearing: Weight bearing as tolerated    Mobility  Bed Mobility                  Transfers Overall transfer level: Needs assistance Equipment used: Rolling walker (2 wheeled) Transfers: Sit to/from Stand Sit to Stand: Supervision         General transfer comment: Min guard for safety, pt does not need cues for hand placement. Requires extra time  Ambulation/Gait Ambulation/Gait assistance: Supervision Ambulation Distance (Feet): 150 Feet Assistive device: Rolling walker (2 wheeled) Gait Pattern/deviations: Step-through pattern;Antalgic;Decreased step length - right;Decreased stance time - left Gait velocity: decreased   General Gait Details: Continues to progress well with ambulation. Verbal cues to increase R step length and increase WB on LLE. Pt with no LOB and safely using RW.   Stairs            Wheelchair Mobility    Modified Rankin (Stroke  Patients Only)       Balance                                    Cognition Arousal/Alertness: Awake/alert Behavior During Therapy: WFL for tasks assessed/performed Overall Cognitive Status: Within Functional Limits for tasks assessed                      Exercises General Exercises - Lower Extremity Ankle Circles/Pumps: AROM;Both;10 reps;Seated    General Comments        Pertinent Vitals/Pain Pain 2/10 - states nurse brought him medication recently Pt repositioned in chair for comfort.    Home Living                      Prior Function            PT Goals (current goals can now be found in the care plan section) Acute Rehab PT Goals Patient Stated Goal: Go home as soon as possible PT Goal Formulation: With patient Time For Goal Achievement: 02/16/14 Potential to Achieve Goals: Good Progress towards PT goals: Progressing toward goals    Frequency  7X/week    PT Plan Current plan remains appropriate    Co-evaluation             End of Session Equipment Utilized During Treatment: Gait belt Activity Tolerance: Patient tolerated treatment well Patient left: in chair;with call bell/phone within reach     Time: [id number] PT Time Calculation (min): 17 min  Charges:  $Gait Training: 8-22 mins  G Codes:      Charlsie Merles, [place] [phone]  Berton Mount 02/10/2014, 10:19 AM

## 2014-02-10 NOTE — Progress Notes (Signed)
02/10/14 0900  OT Visit Information  Last OT Received On 02/10/14  Assistance Needed +1  History of Present Illness 78 y.o. s/p TOTAL HIP ARTHROPLASTY (Left)  OT Time Calculation  OT Start Time 0803  OT Stop Time 0812  OT Time Calculation (min) 9 min  Precautions  Precautions Posterior Hip;Fall  Required Braces or Orthoses Other Brace/Splint  Other Brace/Splint abductor pillow  ADL  General ADL Comments reviewed hip precautions related to ADLs.  Pt can state precautions but has a harder time applying them.  Reinforced standing for toilet hygiene and shower sequence.  Pt practiced yesterday but did not recall sequence.  Will attempt to stop by later when wife is present to see if she has any questions and reinforce thps.  Pt verbalizes sequence for sit to stand with UE/LE positions.13:53--checked several times this am and did not see wife.  He is now discharged.  HH can continue education  Restrictions  LLE Weight Bearing WBAT  OT Assessment/Plan  Follow Up Recommendations Supervision/Assistance - 24 hour  OT Equipment (AE vs assistance)  OT Goal Progression  Progress towards OT goals Progressing toward goals  OT General Charges  $OT Visit 1 Procedure  OT Treatments  $Self Care/Home Management  8-22 mins  Jonathan OtterMaryellen Sherrica Daniels, OTR/L [phone] 02/10/2014

## 2014-02-10 NOTE — Discharge Summary (Addendum)
Physician Discharge Summary  Patient ID: Jonathan Daniels. MRN: [id number] DOB/AGE: 12-06-1935 78 y.o.  Admit date: 02/08/2014 Discharge date: 02/10/2014  Admission Diagnoses:  Primary osteoarthritis of left hip  Discharge Diagnoses:  Principal Problem:   Primary osteoarthritis of left hip Active Problems:   Hip arthritis   Past Medical History  Diagnosis Date  . GERD (gastroesophageal reflux disease)   . BPH (benign prostatic hyperplasia)     but not on any meds  . Hyperlipidemia     was on med but has been off for a yr-medical md is aware  . Hypertension     was on med but has been off a yr-medical md is aware  . Arthritis   . Joint swelling   . Urinary frequency   . Diabetes mellitus     only takes Cinnamon  . Glaucoma     uses eye drops daily  . Primary osteoarthritis of left hip 05/27/2013    Surgeries: Procedure(s): TOTAL HIP ARTHROPLASTY on 02/08/2014   Consultants (if any):    Discharged Condition: Improved  Hospital Course: Jonathan Weinert. is an 78 y.o. male who was admitted 02/08/2014 with a diagnosis of Primary osteoarthritis of left hip and went to the operating room on 02/08/2014 and underwent the above named procedures.    He was given perioperative antibiotics:      Anti-infectives   Start     Dose/Rate Route Frequency Ordered Stop   02/08/14 1630  ceFAZolin (ANCEF) IVPB 2 g/50 mL premix     2 g 100 mL/hr over 30 Minutes Intravenous Every 6 hours 02/08/14 1534 02/08/14 2245   02/08/14 0600  ceFAZolin (ANCEF) IVPB 2 g/50 mL premix     2 g 100 mL/hr over 30 Minutes Intravenous On call to O.R. 02/07/14 1427 02/08/14 0754    .  He was given sequential compression devices, early ambulation, and lovenox for DVT prophylaxis, but pod 2 he refused injections and was switched to xarelto..  He benefited maximally from the hospital stay and there were no complications.    Recent vital signs:  Filed Vitals:   02/10/14 0605  BP: 116/54  Pulse: 88  Temp:  98.5 F (36.9 C)  Resp: 18    Recent laboratory studies:  Lab Results  Component Value Date   HGB 9.8* 02/09/2014   HGB 9.8* 02/09/2014   HGB 12.6* 02/08/2014   HGB 13.8 02/01/2014   Lab Results  Component Value Date   WBC 10.6* 02/09/2014   PLT 133* 02/09/2014   Lab Results  Component Value Date   INR 0.98 02/01/2014   Lab Results  Component Value Date   NA 137 02/09/2014   K 4.3 02/09/2014   CL 102 02/09/2014   CO2 22 02/09/2014   BUN 17 02/09/2014   CREATININE 1.23 02/09/2014   GLUCOSE 197* 02/09/2014    Discharge Medications:     Medication List    STOP taking these medications       acetaminophen 325 MG tablet  Commonly known as:  TYLENOL     meloxicam 7.5 MG tablet  Commonly known as:  MOBIC      TAKE these medications       aspirin 81 MG tablet  Take 81 mg by mouth daily.     baclofen 10 MG tablet  Commonly known as:  LIORESAL  Take 1 tablet (10 mg total) by mouth 3 (three) times daily. As needed for muscle spasm     bimatoprost 0.03 %  ophthalmic solution  Commonly known as:  LUMIGAN  Place 1 drop into both eyes at bedtime.     CINNAMON PO  Take 2 capsules by mouth daily.     COMBIGAN 0.2-0.5 % ophthalmic solution  Generic drug:  brimonidine-timolol  Place 1 drop into both eyes every 12 (twelve) hours.     Fish Oil 1000 MG Caps  Take 1 capsule by mouth daily.     GLUCOSAMINE CHONDR COMPLEX PO  Take by mouth.     HYDROcodone-acetaminophen 10-325 MG per tablet  Commonly known as:  NORCO  Take 1-2 tablets by mouth every 6 (six) hours as needed.     multivitamin tablet  Take 1 tablet by mouth daily.     rivaroxaban 10 MG Tabs tablet  Commonly known as:  XARELTO  Take 1 tablet (10 mg total) by mouth daily.     sennosides-docusate sodium 8.6-50 MG tablet  Commonly known as:  SENOKOT-S  Take 2 tablets by mouth daily.        Diagnostic Studies: Dg Chest 2 View  02/01/2014   CLINICAL DATA:  For total left hip arthroplasty.  Hypertension.  EXAM: CHEST  2  VIEW  COMPARISON:  09/13/2010  FINDINGS: The heart size and mediastinal contours are within normal limits. Both lungs are clear. The visualized skeletal structures are unremarkable.  IMPRESSION: No active cardiopulmonary disease.   Electronically Signed   By: Amie Portlandavid  Ormond M.D.   On: 02/01/2014 12:08   Dg Pelvis Portable  02/08/2014   CLINICAL DATA:  Status post left hip arthroplasty.  EXAM: PORTABLE PELVIS 1-2 VIEWS  COMPARISON:  None.  FINDINGS: Left hip arthroplasty appears well aligned on this single view. The acetabular and femoral prosthetic components are well-seated. No evidence of an operative complication.  IMPRESSION: Well aligned left hip prosthesis   Electronically Signed   By: Amie Portlandavid  Ormond M.D.   On: 02/08/2014 11:03   Dg Hip Portable 1 View Left  02/08/2014   CLINICAL DATA:  Status post left total hip arthroplasty.  EXAM: PORTABLE LEFT HIP - 1 VIEW  COMPARISON:  DG HIP COMPLETE*L* dated 05/27/2013  FINDINGS: A single cross-table lateral portable view of the hip reveals the presence of a prosthetic joint. Evaluation of the acetabular component is quite limited due to overlying soft tissues. However the femoral component appears normal. The native bone exhibits no acute abnormality.  IMPRESSION: The study is limited but there is no evidence of an immediate post procedure complication.   Electronically Signed   By: David  Swazil[place]   On: 02/08/2014 11:17    Disposition:   Discharge Orders   Future Orders Complete By Expires   Posterior total hip precautions  As directed    Weight bearing as tolerated  As directed    Questions:     Laterality:     Extremity:        Follow-up Information   Follow up with Eulas PostLANDAU,Teran Daughenbaugh P, MD. Schedule an appointment as soon as possible for a visit in 2 weeks.   Specialty:  Orthopedic Surgery   Contact information:   [address] [place] [address] [phone]       [facility] ([facility] will contact you concerning physical therapy start day and time.)    Contact information:   [address] [place] [address] [phone]        Signed: Eulas PostLANDAU,Shayona Hibbitts P 02/10/2014, 8:31 AM

## 2014-02-11 LAB — GLUCOSE, CAPILLARY: Glucose-Capillary: 185 mg/dL — ABNORMAL HIGH (ref 70–99)

## 2018-05-28 ENCOUNTER — Emergency Department (HOSPITAL_COMMUNITY): Payer: Medicare Other

## 2018-05-28 ENCOUNTER — Encounter (HOSPITAL_COMMUNITY): Payer: Self-pay | Admitting: Emergency Medicine

## 2018-05-28 ENCOUNTER — Inpatient Hospital Stay (HOSPITAL_COMMUNITY)
Admission: EM | Admit: 2018-05-28 | Discharge: 2018-06-01 | DRG: 041 | Disposition: A | Discharge status: Home / Self Care | Payer: Medicare Other | Attending: Family Medicine | Admitting: Family Medicine

## 2018-05-28 ENCOUNTER — Other Ambulatory Visit: Payer: Self-pay

## 2018-05-28 DIAGNOSIS — R972 Elevated prostate specific antigen [PSA]: Secondary | ICD-10-CM | POA: Diagnosis present

## 2018-05-28 DIAGNOSIS — R4781 Slurred speech: Secondary | ICD-10-CM

## 2018-05-28 DIAGNOSIS — R471 Dysarthria and anarthria: Secondary | ICD-10-CM

## 2018-05-28 DIAGNOSIS — R29702 NIHSS score 2: Secondary | ICD-10-CM | POA: Diagnosis present

## 2018-05-28 DIAGNOSIS — E7800 Pure hypercholesterolemia, unspecified: Secondary | ICD-10-CM | POA: Diagnosis present

## 2018-05-28 DIAGNOSIS — R402362 Coma scale, best motor response, obeys commands, at arrival to emergency department: Secondary | ICD-10-CM | POA: Diagnosis present

## 2018-05-28 DIAGNOSIS — R402142 Coma scale, eyes open, spontaneous, at arrival to emergency department: Secondary | ICD-10-CM | POA: Diagnosis present

## 2018-05-28 DIAGNOSIS — I10 Essential (primary) hypertension: Secondary | ICD-10-CM | POA: Diagnosis present

## 2018-05-28 DIAGNOSIS — E785 Hyperlipidemia, unspecified: Secondary | ICD-10-CM | POA: Diagnosis present

## 2018-05-28 DIAGNOSIS — E119 Type 2 diabetes mellitus without complications: Secondary | ICD-10-CM

## 2018-05-28 DIAGNOSIS — R402252 Coma scale, best verbal response, oriented, at arrival to emergency department: Secondary | ICD-10-CM | POA: Diagnosis present

## 2018-05-28 DIAGNOSIS — Z96642 Presence of left artificial hip joint: Secondary | ICD-10-CM | POA: Diagnosis present

## 2018-05-28 DIAGNOSIS — R29810 Facial weakness: Secondary | ICD-10-CM | POA: Diagnosis not present

## 2018-05-28 DIAGNOSIS — Q211 Atrial septal defect: Secondary | ICD-10-CM

## 2018-05-28 DIAGNOSIS — I63311 Cerebral infarction due to thrombosis of right middle cerebral artery: Secondary | ICD-10-CM

## 2018-05-28 DIAGNOSIS — E1149 Type 2 diabetes mellitus with other diabetic neurological complication: Secondary | ICD-10-CM | POA: Diagnosis not present

## 2018-05-28 DIAGNOSIS — Z7982 Long term (current) use of aspirin: Secondary | ICD-10-CM

## 2018-05-28 DIAGNOSIS — I639 Cerebral infarction, unspecified: Secondary | ICD-10-CM | POA: Diagnosis present

## 2018-05-28 DIAGNOSIS — Z79899 Other long term (current) drug therapy: Secondary | ICD-10-CM

## 2018-05-28 DIAGNOSIS — I6340 Cerebral infarction due to embolism of unspecified cerebral artery: Secondary | ICD-10-CM | POA: Diagnosis not present

## 2018-05-28 DIAGNOSIS — Z833 Family history of diabetes mellitus: Secondary | ICD-10-CM

## 2018-05-28 DIAGNOSIS — H409 Unspecified glaucoma: Secondary | ICD-10-CM | POA: Diagnosis present

## 2018-05-28 HISTORY — DX: Type 2 diabetes mellitus without complications: E11.9

## 2018-05-28 HISTORY — DX: Cerebral infarction, unspecified: I63.9

## 2018-05-28 LAB — COMPREHENSIVE METABOLIC PANEL
ALBUMIN: 3.7 g/dL (ref 3.5–5.0)
ALK PHOS: 80 U/L (ref 38–126)
ALT: 20 U/L (ref 0–44)
AST: 24 U/L (ref 15–41)
Anion gap: 9 (ref 5–15)
BUN: 12 mg/dL (ref 8–23)
CO2: 23 mmol/L (ref 22–32)
Calcium: 9.6 mg/dL (ref 8.9–10.3)
Chloride: 107 mmol/L (ref 98–111)
Creatinine, Ser: 1.27 mg/dL — ABNORMAL HIGH (ref 0.61–1.24)
GFR calc Af Amer: 60 mL/min — ABNORMAL LOW (ref >60)
GFR calc non Af Amer: 52 mL/min — ABNORMAL LOW (ref >60)
GLUCOSE: 174 mg/dL — AB (ref 70–99)
POTASSIUM: 4 mmol/L (ref 3.5–5.1)
Sodium: 139 mmol/L (ref 135–145)
Total Bilirubin: 1 mg/dL (ref 0.3–1.2)
Total Protein: 6.6 g/dL (ref 6.5–8.1)

## 2018-05-28 LAB — DIFFERENTIAL
Abs Immature Granulocytes: 0 10*3/uL (ref 0.0–0.1)
Basophils Absolute: 0.1 10*3/uL (ref 0.0–0.1)
Basophils Relative: 1 %
EOS ABS: 0.3 10*3/uL (ref 0.0–0.7)
Eosinophils Relative: 3 %
IMMATURE GRANULOCYTES: 0 %
LYMPHS PCT: 18 %
Lymphs Abs: 1.6 10*3/uL (ref 0.7–4.0)
Monocytes Absolute: 0.9 10*3/uL (ref 0.1–1.0)
Monocytes Relative: 10 %
Neutro Abs: 5.8 10*3/uL (ref 1.7–7.7)
Neutrophils Relative %: 68 %

## 2018-05-28 LAB — APTT: aPTT: 32 seconds (ref 24–36)

## 2018-05-28 LAB — CBC
HCT: 39.8 % (ref 39.0–52.0)
Hemoglobin: 12.8 g/dL — ABNORMAL LOW (ref 13.0–17.0)
MCH: 28 pg (ref 26.0–34.0)
MCHC: 32.2 g/dL (ref 30.0–36.0)
MCV: 87.1 fL (ref 78.0–100.0)
Platelets: 197 10*3/uL (ref 150–400)
RBC: 4.57 MIL/uL (ref 4.22–5.81)
RDW: 14.2 % (ref 11.5–15.5)
WBC: 8.6 10*3/uL (ref 4.0–10.5)

## 2018-05-28 LAB — PROTIME-INR
INR: 1.07
Prothrombin Time: 13.8 seconds (ref 11.4–15.2)

## 2018-05-28 LAB — CBG MONITORING, ED: Glucose-Capillary: 167 mg/dL — ABNORMAL HIGH (ref 70–99)

## 2018-05-28 MED ORDER — BRIMONIDINE TARTRATE 0.2 % OP SOLN
1.0000 [drp] | Freq: Two times a day (BID) | OPHTHALMIC | Status: DC
  Administered 2018-05-28 – 2018-06-01 (×7): 1 [drp] via OPHTHALMIC
  Filled 2018-05-28: qty 5

## 2018-05-28 MED ORDER — SODIUM CHLORIDE 0.9 % IV SOLN
INTRAVENOUS | Status: DC
  Administered 2018-06-01: 08:00:00 via INTRAVENOUS

## 2018-05-28 MED ORDER — SENNOSIDES-DOCUSATE SODIUM 8.6-50 MG PO TABS: 1.0000 | ORAL_TABLET | Freq: Every evening | ORAL | Status: DC | PRN

## 2018-05-28 MED ORDER — ACETAMINOPHEN 325 MG PO TABS: 650.0000 mg | ORAL_TABLET | ORAL | Status: DC | PRN

## 2018-05-28 MED ORDER — BRIMONIDINE TARTRATE-TIMOLOL 0.2-0.5 % OP SOLN: 1.0000 [drp] | Freq: Two times a day (BID) | OPHTHALMIC | Status: DC

## 2018-05-28 MED ORDER — ASPIRIN 325 MG PO TABS
325.0000 mg | ORAL_TABLET | Freq: Every day | ORAL | Status: DC
  Administered 2018-05-29: 325 mg via ORAL
  Filled 2018-05-28: qty 1

## 2018-05-28 MED ORDER — TIMOLOL MALEATE 0.5 % OP SOLN
1.0000 [drp] | Freq: Two times a day (BID) | OPHTHALMIC | Status: DC
  Administered 2018-05-28 – 2018-06-01 (×7): 1 [drp] via OPHTHALMIC
  Filled 2018-05-28: qty 5

## 2018-05-28 MED ORDER — ASPIRIN 300 MG RE SUPP: 300.0000 mg | Freq: Once | RECTAL | Status: AC

## 2018-05-28 MED ORDER — INSULIN ASPART 100 UNIT/ML ~~LOC~~ SOLN
0.0000 [IU] | Freq: Three times a day (TID) | SUBCUTANEOUS | Status: DC
  Administered 2018-05-29 – 2018-05-31 (×3): 1 [IU] via SUBCUTANEOUS

## 2018-05-28 MED ORDER — ACETAMINOPHEN 160 MG/5ML PO SOLN: 650.0000 mg | ORAL | Status: DC | PRN

## 2018-05-28 MED ORDER — ACETAMINOPHEN 650 MG RE SUPP: 650.0000 mg | RECTAL | Status: DC | PRN

## 2018-05-28 MED ORDER — LORAZEPAM 2 MG/ML IJ SOLN
1.0000 mg | Freq: Once | INTRAMUSCULAR | Status: DC
  Filled 2018-05-28: qty 1

## 2018-05-28 MED ORDER — STROKE: EARLY STAGES OF RECOVERY BOOK: Freq: Once | Status: DC

## 2018-05-28 MED ORDER — ATORVASTATIN CALCIUM 80 MG PO TABS
80.0000 mg | ORAL_TABLET | Freq: Every day | ORAL | Status: DC
  Administered 2018-05-28: 80 mg via ORAL
  Filled 2018-05-28: qty 1

## 2018-05-28 MED ORDER — ADULT MULTIVITAMIN W/MINERALS CH
1.0000 | ORAL_TABLET | Freq: Every day | ORAL | Status: DC
  Administered 2018-05-29 – 2018-06-01 (×4): 1 via ORAL
  Filled 2018-05-28 (×4): qty 1

## 2018-05-28 MED ORDER — ASPIRIN 325 MG PO TABS
325.0000 mg | ORAL_TABLET | Freq: Once | ORAL | Status: AC
  Administered 2018-05-28: 325 mg via ORAL
  Filled 2018-05-28: qty 1

## 2018-05-28 MED ORDER — LATANOPROST 0.005 % OP SOLN
1.0000 [drp] | Freq: Every day | OPHTHALMIC | Status: DC
  Administered 2018-05-28 – 2018-05-31 (×4): 1 [drp] via OPHTHALMIC
  Filled 2018-05-28: qty 2.5

## 2018-05-28 NOTE — ED Provider Notes (Signed)
[facility] EMERGENCY DEPARTMENT Provider Note   CSN: [id number] Arrival date & time: 05/28/18  0945     History   Chief Complaint Chief Complaint  Patient presents with  . Facial Droop    HPI Jonathan Daniels. is a 82 y.o. male with history of hypertension, diabetes, hyperlipidemia, GERD who presents with slurred speech and left-sided facial droop.  Patient reports his symptoms began around 3 PM yesterday, lasted an hour, and then went away.  Patient then woke up with the symptoms this morning.  He was last known well when he went to bed last night, except for the transient symptoms earlier in the day.  Patient denies any pain.  He denies any weakness or numbness.  He denies any chest pain, shortness of breath, headache, abdominal pain, nausea, vomiting, dizziness, vision changes.  HPI  Past Medical History:  Diagnosis Date  . Arthritis   . BPH (benign prostatic hyperplasia)    but not on any meds  . Diabetes mellitus    only takes Cinnamon  . GERD (gastroesophageal reflux disease)   . Glaucoma    uses eye drops daily  . Hyperlipidemia    was on med but has been off for a yr-medical md is aware  . Hypertension    was on med but has been off a yr-medical md is aware  . Joint swelling   . Primary osteoarthritis of left hip 05/27/2013  . Urinary frequency     Patient Active Problem List   Diagnosis Date Noted  . Stroke (HCC) 05/28/2018  . Hip arthritis 02/08/2014  . Primary osteoarthritis of left hip 05/27/2013  . Dementia arising in the senium and presenium 05/27/2013  . Type II or unspecified type diabetes mellitus without mention of complication, uncontrolled 12/04/2011  . Pure hypercholesterolemia 10/30/2011  . Glaucoma 10/30/2011  . Routine general medical examination at a health care facility 10/30/2011  . PSA, INCREASED 11/26/2010  . HYPERTENSION 09/24/2010  . GERD 09/24/2010  . HYPERTROPHY PROSTATE W/UR OBST & OTH LUTS 09/24/2010    Past  Surgical History:  Procedure Laterality Date  . CARDIAC CATHETERIZATION  2011  . COLONOSCOPY    . patella tendons broke Bilateral   . ROTATOR CUFF REPAIR Right   . SHOULDER ARTHROSCOPY WITH BICEPS TENDON REPAIR Right   . TOTAL HIP ARTHROPLASTY Left 02/08/2014   DR LANDAU  . TOTAL HIP ARTHROPLASTY Left 02/08/2014   Procedure: TOTAL HIP ARTHROPLASTY;  Surgeon: Eulas Post, MD;  Location: [facility] OR;  Service: Orthopedics;  Laterality: Left;        Home Medications    Prior to Admission medications   Medication Sig Start Date End Date Taking? Authorizing Provider  aspirin 81 MG tablet Take 81 mg by mouth daily.     Yes [provider]  bimatoprost (LUMIGAN) 0.03 % ophthalmic solution Place 1 drop into both eyes at bedtime.   Yes [provider]  brimonidine-timolol (COMBIGAN) 0.2-0.5 % ophthalmic solution Place 1 drop into both eyes every 12 (twelve) hours.   Yes [provider]  CINNAMON PO Take 2 capsules by mouth daily.   Yes [provider]  Glucosamine-Chondroitin (GLUCOSAMINE CHONDR COMPLEX PO) Take by mouth.     Yes [provider]  ibuprofen (ADVIL,MOTRIN) 200 MG tablet Take 400 mg by mouth every 6 (six) hours as needed.   Yes [provider]  Multiple Vitamin (MULTIVITAMIN) tablet Take 1 tablet by mouth daily.     Yes [provider]  baclofen (LIORESAL) 10 MG tablet Take 1 tablet (10 mg total) by mouth 3 (three) times daily. As needed for muscle spasm Patient not taking: Reported on 05/28/2018 02/08/14   Teryl Lucy, MD  HYDROcodone-acetaminophen [facility]) 10-325 MG per tablet Take 1-2 tablets by mouth every 6 (six) hours as needed. Patient not taking: Reported on 05/28/2018 02/08/14   Teryl Lucy, MD  rivaroxaban (XARELTO) 10 MG TABS tablet Take 1 tablet (10 mg total) by mouth daily. Patient not taking: Reported on 05/28/2018 02/10/14   Teryl Lucy, MD  sennosides-docusate sodium (SENOKOT-S) 8.6-50 MG tablet Take 2  tablets by mouth daily. Patient not taking: Reported on 05/28/2018 02/08/14   Teryl Lucy, MD    Family History Family History  Problem Relation Age of Onset  . Kidney disease Other   . Diabetes Father   . Cancer Neg Hx   . Early death Neg Hx   . Heart disease Neg Hx   . Hypertension Neg Hx   . Hyperlipidemia Neg Hx   . Stroke Neg Hx     Social History Social History   Tobacco Use  . Smoking status: Never Smoker  . Smokeless tobacco: Never Used  Substance Use Topics  . Alcohol use: No  . Drug use: No     Allergies   Patient has no known allergies.   Review of Systems Review of Systems  Constitutional: Negative for chills and fever.  HENT: Negative for facial swelling and sore throat.   Respiratory: Negative for shortness of breath.   Cardiovascular: Negative for chest pain.  Gastrointestinal: Negative for abdominal pain, nausea and vomiting.  Genitourinary: Negative for dysuria.  Musculoskeletal: Negative for back pain.  Skin: Negative for rash and wound.  Neurological: Positive for facial asymmetry and speech difficulty. Negative for dizziness, weakness, light-headedness and headaches.  Psychiatric/Behavioral: The patient is not nervous/anxious.      Physical Exam Updated Vital Signs BP (!) 171/87   Pulse 62   Temp 97.9 F (36.6 C)   Resp 14   SpO2 98%   Physical Exam  Constitutional: He appears well-developed and well-nourished. No distress.  HENT:  Head: Normocephalic and atraumatic.  Mouth/Throat: Oropharynx is clear and moist. No oropharyngeal exudate.  Eyes: Pupils are equal, round, and reactive to light. Conjunctivae and EOM are normal. Right eye exhibits no discharge. Left eye exhibits no discharge. No scleral icterus.  Neck: Normal range of motion. Neck supple. No thyromegaly present.  Cardiovascular: Normal rate, regular rhythm, normal heart sounds and intact distal pulses. Exam reveals no gallop and no friction rub.  No murmur  heard. Pulmonary/Chest: Effort normal and breath sounds normal. No stridor. No respiratory distress. He has no wheezes. He has no rales.  Abdominal: Soft. Bowel sounds are normal. He exhibits no distension. There is no tenderness. There is no rebound and no guarding.  Musculoskeletal: He exhibits no edema.  Lymphadenopathy:    He has no cervical adenopathy.  Neurological: He is alert. Coordination normal.  CN 3-12 intact; normal sensation throughout; 5/5 strength in all 4 extremities; equal bilateral grip strength; no ataxia on finger-to-nose, subtle left-sided facial droop, subtle slurred speech  Skin: Skin is warm and dry. No rash noted. He is not diaphoretic. No pallor.  Psychiatric: He has a normal mood and affect.  Nursing note and vitals reviewed.    ED Treatments / Results  Labs (all labs ordered are listed, but only abnormal results are displayed) Labs Reviewed  CBC - Abnormal; Notable for  the following components:      Result Value   Hemoglobin 12.8 (*)    All other components within normal limits  COMPREHENSIVE METABOLIC PANEL - Abnormal; Notable for the following components:   Glucose, Bld 174 (*)    Creatinine, Ser 1.27 (*)    GFR calc non Af Amer 52 (*)    GFR calc Af Amer 60 (*)    All other components within normal limits  CBG MONITORING, ED - Abnormal; Notable for the following components:   Glucose-Capillary 167 (*)    All other components within normal limits  PROTIME-INR  APTT  DIFFERENTIAL    EKG None  Radiology Ct Head Wo Contrast  Result Date: 05/28/2018 CLINICAL DATA:  Slurred speech with left-sided facial droop EXAM: CT HEAD WITHOUT CONTRAST TECHNIQUE: Contiguous axial images were obtained from the base of the skull through the vertex without intravenous contrast. COMPARISON:  None. FINDINGS: Brain: There is age related volume loss. There is no mass, hemorrhage, extra-axial fluid collection, or midline shift. There is focal decreased attenuation in  the posterior mid to superior right frontal lobe, an appearance suspicious for acute infarct. This appearance is best seen on axial slice 20 series 3, coronal slice 35 series 5, and sagittal slice 16 series 6. Elsewhere, there is mild small vessel disease in the centra semiovale bilaterally. Small foci of basal ganglia calcification are likely physiologic. Vascular: There is no appreciable hyperdense vessel. There is mild calcification in the right carotid siphon region. Skull: Bony calvarium appears intact. Sinuses/Orbits: There is extensive opacification in the right sphenoid sinus region. There is mucosal thickening and opacification in several ethmoid air cells bilaterally. Visualized orbits appear symmetric bilaterally. Other: Visualized mastoid air cells are clear. IMPRESSION: Focal decreased attenuation in the right mid to superior frontal lobe, suspicious for early infarct in this area. No other findings which are felt to be indicative of acute infarct. There is age related volume loss with slight periventricular small vessel disease. No mass or hemorrhage evident. Mild calcification in the right carotid siphon noted. Extensive paranasal sinus disease noted, most notably in the right sphenoid sinus. These results were called by telephone at the time of interpretation on 05/28/2018 at 11:57 am to Dr. Gerhard MunchOBERT LOCKWOOD , who verbally acknowledged these results. Electronically Signed   By: Bretta BangWilliam  Woodruff III M.D.   On: 05/28/2018 11:57   Mr Brain Wo Contrast  Result Date: 05/28/2018 CLINICAL DATA:  Dysarthria, and LEFT facial droop upon awakening. EXAM: MRI HEAD WITHOUT CONTRAST TECHNIQUE: Multiplanar, multiecho pulse sequences of the brain and surrounding structures were obtained without intravenous contrast. COMPARISON:  CT head earlier today. FINDINGS: The patient would not allow completion of the scan. Axial T1 weighted images were omitted. Overall the study is diagnostic. Brain: Multifocal areas of  restricted diffusion, RIGHT MCA distribution, affect the RIGHT insula, frontal operculum, as well as the prefrontal gyrus over the convexity, corresponding low ADC, consistent with acute infarction. These are primarily cortical in nature, although some subcortical white matter may be affected. No similar findings on the LEFT or in the posterior circulation. No hemorrhage, mass lesion, hydrocephalus, or extra-axial fluid. Cerebral and cerebellar atrophy. Mild subcortical and periventricular T2 and FLAIR hyperintensities, likely chronic microvascular ischemic change. Small BILATERAL chronic thalamic infarcts. Vascular: Flow voids are maintained. There are no areas of chronic hemorrhage. Skull and upper cervical spine: Normal marrow signal. Sinuses/Orbits: Negative. Other: Compared with recent CT, there is good general agreement. IMPRESSION: Nonhemorrhagic acute infarction affecting the RIGHT  insula and RIGHT frontal lobe, primarily cortical in nature. Atrophy and small vessel disease. No proximal large vessel occlusion is evident. Electronically Signed   By: Elsie Stain M.D.   On: 05/28/2018 15:26    Procedures Procedures (including critical care time)  Medications Ordered in ED Medications  LORazepam (ATIVAN) injection 1 mg (1 mg Intramuscular Not Given 05/28/18 1532)     Initial Impression / Assessment and Plan / ED Course  I have reviewed the triage vital signs and the nursing notes.  Pertinent labs & imaging results that were available during my care of the patient were reviewed by me and considered in my medical decision making (see chart for details).  Clinical Course as of May 28 1532  Thu May 28, 2018  1336 Patient evaluated by neurology who advised MRI of the brain and admission to medicine.   [AL]    Clinical Course User Index [AL] Emi Holes, PA-C    Patient presenting with left-sided facial droop and slurred speech.  CT shows findings concerning for early infarct in the  right mid to superior frontal lobe.  Labs show mild elevation in creatinine, 1.27, mild anemia, 12.8.  Patient evaluated by neurology, Dr. Wilford Corner, who advised MRI and admission to medicine.  MRI confirms nonhemorrhagic acute infarction affecting the right insula and right frontal lobe, primarily cortical nature.  I discussed patient case with Dr. Dareen Piano with the family medicine teaching service who accepts patient for admission.  I appreciate the above consultants assistance with the patient.  Patient understands and agrees with plan.  Final Clinical Impressions(s) / ED Diagnoses   Final diagnoses:  Cerebrovascular accident (CVA), unspecified mechanism [facility])  Facial droop  Slurred speech    ED Discharge Orders    None       Verdis Prime 05/28/18 1533    Loren Racer, MD 05/28/18 2135

## 2018-05-28 NOTE — ED Triage Notes (Signed)
Pt refused to have IV started 

## 2018-05-28 NOTE — Consult Note (Addendum)
Neurology Consultation  Reason for Consult: Left facial droop and dysarthria Referring Physician: Ranae Palms  CC: Left facial droop and dysarthria  History is obtained from: Wife  HPI: Jonathan Daniels. is a 82 y.o. male past medical history of hypertension, hyperlipidemia, diabetes.  Wife states that his diabetes is well controlled however his hypertension is not.  Patient does take aspirin every day but has not taken it in the last 2 weeks.   Per wife yesterday she noted that at approximately 3:00 in the PM he had dysarthria for approximately 1-1/2 hours.  Later that night he had no problems and they went bowling.  He went to sleep and upon waking he noted that he had a left facial droop along with dysarthria and some left hand numbness.  At present time numbness is not present but the facial droop and dysarthria is still present. He was brought in to the hospital on insistence of his wife for the acute neurological change.  LKW: 11 PM tpa given?: no, out of window Premorbid modified Rankin scale (mRS): 0  ROS: ROS was performed and is negative except as noted in the HPI.   Past Medical History:  Diagnosis Date  . Arthritis   . BPH (benign prostatic hyperplasia)    but not on any meds  . Diabetes mellitus    only takes Cinnamon  . GERD (gastroesophageal reflux disease)   . Glaucoma    uses eye drops daily  . Hyperlipidemia    was on med but has been off for a yr-medical md is aware  . Hypertension    was on med but has been off a yr-medical md is aware  . Joint swelling   . Primary osteoarthritis of left hip 05/27/2013  . Urinary frequency      Family History  Problem Relation Age of Onset  . Kidney disease Other   . Diabetes Father   . Cancer Neg Hx   . Early death Neg Hx   . Heart disease Neg Hx   . Hypertension Neg Hx   . Hyperlipidemia Neg Hx   . Stroke Neg Hx     Social History:   reports that he has never smoked. He has never used smokeless tobacco. He reports  that he does not drink alcohol or use drugs.  Medications No current facility-administered medications for this encounter.   Current Outpatient Medications:  .  aspirin 81 MG tablet, Take 81 mg by mouth daily.  , Disp: , Rfl:  .  bimatoprost (LUMIGAN) 0.03 % ophthalmic solution, Place 1 drop into both eyes at bedtime., Disp: , Rfl:  .  brimonidine-timolol (COMBIGAN) 0.2-0.5 % ophthalmic solution, Place 1 drop into both eyes every 12 (twelve) hours., Disp: , Rfl:  .  CINNAMON PO, Take 2 capsules by mouth daily., Disp: , Rfl:  .  Glucosamine-Chondroitin (GLUCOSAMINE CHONDR COMPLEX PO), Take by mouth.  , Disp: , Rfl:  .  ibuprofen (ADVIL,MOTRIN) 200 MG tablet, Take 400 mg by mouth every 6 (six) hours as needed., Disp: , Rfl:  .  Multiple Vitamin (MULTIVITAMIN) tablet, Take 1 tablet by mouth daily.  , Disp: , Rfl:  .  baclofen (LIORESAL) 10 MG tablet, Take 1 tablet (10 mg total) by mouth 3 (three) times daily. As needed for muscle spasm (Patient not taking: Reported on 05/28/2018), Disp: 50 each, Rfl: 0 .  HYDROcodone-acetaminophen (NORCO) 10-325 MG per tablet, Take 1-2 tablets by mouth every 6 (six) hours as needed. (Patient not taking: Reported  on 05/28/2018), Disp: 75 tablet, Rfl: 0 .  rivaroxaban (XARELTO) 10 MG TABS tablet, Take 1 tablet (10 mg total) by mouth daily. (Patient not taking: Reported on 05/28/2018), Disp: 21 tablet, Rfl: 0 .  sennosides-docusate sodium (SENOKOT-S) 8.6-50 MG tablet, Take 2 tablets by mouth daily. (Patient not taking: Reported on 05/28/2018), Disp: 30 tablet, Rfl: 1   Exam: Current vital signs: BP (!) 154/79   Pulse 63   Temp 98.5 F (36.9 C) (Oral)   Resp 14   SpO2 100%  Vital signs in last 24 hours: Temp:  [98.5 F (36.9 C)] 98.5 F (36.9 C) (07/18 0953) Pulse Rate:  [63-70] 63 (07/18 1137) Resp:  [14-16] 14 (07/18 1137) BP: (154-175)/(75-79) 154/79 (07/18 1137) SpO2:  [96 %-100 %] 100 % (07/18 1137)  GENERAL: Awake, alert in NAD HEENT: - Normocephalic  and atraumatic,  LUNGS - Clear to auscultation bilaterally with no wheezes CV - S1S2 RRR, no m/r/g, equal pulses bilaterally. ABDOMEN - Soft, nontender, nondistended with normoactive BS Ext: warm, well perfused, intact peripheral pulses NEURO:  Mental Status: AA&Ox3 able to follow commands Language: speech is dysarthric.  Naming, repetition, fluency, and comprehension intact.  No aphasia Cranial Nerves: PERRL 32mm/brisk. EOMI, visual fields full, left lower facial weakness, bilateral facial sensation intact, hearing intact, tongue/uvula/soft palate midline, normal sternocleidomastoid and trapezius muscle strength. No evidence of tongue atrophy or fibrillations Motor: 5/5 strength throughout Tone: is normal and bulk is normal Sensation- Intact to light touch bilaterally Coordination: FTN intact bilaterally, no ataxia in BLE. Gait-antalgic gait secondary to arthritis  NIHSS--2  Labs I have reviewed labs in epic and the results pertinent to this consultation are: CBC    Component Value Date/Time   WBC 8.6 05/28/2018 0956   RBC 4.57 05/28/2018 0956   HGB 12.8 (L) 05/28/2018 0956   HCT 39.8 05/28/2018 0956   PLT 197 05/28/2018 0956   MCV 87.1 05/28/2018 0956   MCH 28.0 05/28/2018 0956   MCHC 32.2 05/28/2018 0956   RDW 14.2 05/28/2018 0956   LYMPHSABS 1.6 05/28/2018 0956   MONOABS 0.9 05/28/2018 0956   EOSABS 0.3 05/28/2018 0956   BASOSABS 0.1 05/28/2018 0956    CMP     Component Value Date/Time   NA 139 05/28/2018 0956   K 4.0 05/28/2018 0956   CL 107 05/28/2018 0956   CO2 23 05/28/2018 0956   GLUCOSE 174 (H) 05/28/2018 0956   BUN 12 05/28/2018 0956   CREATININE 1.27 (H) 05/28/2018 0956   CALCIUM 9.6 05/28/2018 0956   PROT 6.6 05/28/2018 0956   ALBUMIN 3.7 05/28/2018 0956   AST 24 05/28/2018 0956   ALT 20 05/28/2018 0956   ALKPHOS 80 05/28/2018 0956   BILITOT 1.0 05/28/2018 0956   GFRNONAA 52 (L) 05/28/2018 0956   GFRAA 60 (L) 05/28/2018 0956   Imaging I have  reviewed the images obtained:  CT-scan of the brain--focal decreased attenuation in the right mid to superior frontal lobe, suspicious for early infarct  MRI examination of the brain--pending   Attending Neurohospitalist Addendum Patient seen and examined with APP/Resident. Agree with the history and physical as documented above. I have independently reviewed the chart, obtained history, review of systems and examined the patient.I have personally reviewed pertinent head/neck/spine imaging (CT/MRI). CTH suspicious for right frontal hypodensity, ?acute infarct.   Assessment:  82/M with PMH of DM HTN HLD, who had an episode of dysarthria and left facial droop yestrday in the afternon that resolved followed by waking up with  left facial droop and dysarthria this morning that is persistent. Clinical exam shows left lower facial weakness and dysarthria. LSN - 11pm on 05/27/18. Outside tPA window. Exam not suggestive of LVO, hence not a candidate for EVT. CTH with Acute superior frontal lobe-right-stroke.  Further work-up for stroke would be required  Impression: Right superior frontal lobe acute/subacute infarct vs Right cerebral TIA  Recommendations: -Admit to hospitalist -Telemetry monitoring -Allow for permissive hypertension for the first 24-48h - only treat PRN if SBP >220 mmHg. Blood pressures can be gradually normalized to SBP<140 upon discharge. -MRI brain without contrast -CT Angiogram of Head and neck -Echocardiogram -HgbA1c, fasting lipid panel -Frequent neuro checks -Prophylactic therapy-Antiplatelet med: Aspirin - dose 325mg  PO or 300mg  PR -Atorvastatin 80 mg PO daily -Risk factor modification -PT consult, OT consult, Speech consult  Please page stroke NP/PA/MD (listed on AMION)  from 8am-4 pm as this patient will be followed by the stroke team at this point.   -- Milon Dikes, MD [facility] Pager: [phone] If 7pm to 7am, please call on call as  listed on AMION.

## 2018-05-28 NOTE — ED Notes (Signed)
Admitting at bedside for assessment. Pt is allowed to eat as he has passed his SSS.

## 2018-05-28 NOTE — Progress Notes (Addendum)
Family Medicine Teaching Service Daily Progress Note Intern Pager: [phone]  Patient name: Jonathan Daniels. Medical record number: [id number] Date of birth: [birth year] Age: 82 y.o. Gender: male  Primary Care Provider: Patient, No Pcp Per Consultants: Neuro Code Status: Full  Pt Overview and Major Events to Date:  7/18 - admit for CVA  Assessment and Plan: Jonathan Warehime. is a 82 y.o. male presenting with facial droop and dysarthria. PMH is significant for hypertension, T2DM, GERD, and Hip arthroplasty.   Acute Ischemic Stroke: presented with facial droop, slurred speech -place in observation, attending Dr. McDiarmid -monitor on telemetry -neurology consulting, appreciate recommendations -MRI obtained -Echo pending -atorva 80 mg daily -high dose aspirin -permissive HTN, treat SBP if >220 -PT/OT eval -SLP eval  HTN: on problem list, no antihypertensive on home med list -monitor BP -permissive HTN -would treat SBP if > 220  - Recommend Indapamide/ACEi combo for HTN outpatient  HLD: on problem list, no statin. Last LDL in EMR 86 from July 2014. -repeat lipid panel -atorvastatin 80 mg daily  Glaucoma: chronic, stable -continue home eye drops  T2DM: on problem list. Last A1C 2014 elevated to 7.3 -repeat A1C = 6.6% (7/18) -control sugars if elevated  Hx enlarged prostate: elevated PSA - Previously 6.13 > 6.66 > 8.75 (05/27/2013) - Will seek further clarification - Patient denies urinary retention   FEN/GI: Regular Diet Prophylaxis: SCDs  Disposition: place in tele obvs  Subjective:  The patient is seen resting and relaxing in his recliner.  He says he is feeling and speaking better.  He denies new focal neuro deficits, and weakness.  He also denies lightheadedness, headaches, chest pain, shortness of breath, nausea, vomiting, diarrhea, and extremity weakness or pain.  Objective: Vitals:   05/28/18 2316 05/29/18 0411  BP: (!) 151/76 (!) 159/73  Pulse: 61 (!)  55  Resp: 18 18  Temp: 98 F (36.7 C) 98.3 F (36.8 C)  SpO2: 98% 98%   Physical Exam: Constitutional: He is oriented to person, place, and time. He appears well-developed and well-nourished.  HENT:  Head: Normocephalic and atraumatic. Left mouth and cheek continue to droop. Eyes: Conjunctivae and EOM are normal.  Neck: Normal range of motion. Neck supple. No tracheal deviation present. No thyromegaly present.  Cardiovascular: Normal rate, regular rhythm, normal heart sounds and intact distal pulses. Exam reveals no gallop and no friction rub.  No murmur heard. Pulmonary/Chest: Effort normal and breath sounds normal. No stridor. No respiratory distress.  Abdominal: Soft. Bowel sounds are normal. He exhibits no distension. There is no tenderness. There is no rebound and no guarding.  Musculoskeletal: Normal range of motion. He exhibits no edema or deformity.  Neurological: He is alert and oriented to person, place, and time. A cranial nerve deficit (7th CN decreased smile on L; otherwise CN 2-12 intact ) is present. No sensory deficit. He exhibits normal muscle tone. Coordination normal.  Skin: Skin is warm and dry. Capillary refill takes less than 2 seconds. No erythema. No pallor.  Psychiatric: He has a normal mood and affect. His behavior is normal. Judgment and thought content normal.   Laboratory: CBC Latest Ref Rng & Units 05/29/2018 05/28/2018 02/10/2014  WBC 4.0 - 10.5 K/uL 7.9 8.6 13.9(H)  Hemoglobin 13.0 - 17.0 g/dL 11.8(L) 12.8(L) 9.1(L)  Hematocrit 39.0 - 52.0 % 35.8(L) 39.8 25.8(L)  Platelets 150 - 400 K/uL 185 197 128(L)   CMP Latest Ref Rng & Units 05/29/2018 05/28/2018 02/10/2014  Glucose 70 - 99 mg/dL 147(W) 295(A)  140(H)  BUN 8 - 23 mg/dL 12 12 17   Creatinine 0.61 - 1.24 mg/dL 9.601.23 4.54(U1.27(H) 9.811.07  Sodium 135 - 145 mmol/L 141 139 143  Potassium 3.5 - 5.1 mmol/L 4.2 4.0 4.0  Chloride 98 - 111 mmol/L 108 107 107  CO2 22 - 32 mmol/L 26 23 23   Calcium 8.9 - 10.3 mg/dL 9.2 9.6  1.9(J8.2(L)  Total Protein 6.5 - 8.1 g/dL - 6.6 -  Total Bilirubin 0.3 - 1.2 mg/dL - 1.0 -  Alkaline Phos 38 - 126 U/L - 80 -  AST 15 - 41 U/L - 24 -  ALT 0 - 44 U/L - 20 -   Lipid Panel (7/18): Cholesterol 117, Trigl 124, HDL 24, LDL 68 HbA1c: 7.1 > 7.3 > 6.6 (7/18) PT Time (7/18): 13.8 nml INR (7/18): 1.07 nml aPTT (7/18): 32 nml TSH (7/19): 4.837 H  Imaging/Diagnostic Tests: Ct Head Wo Contrast Result Date: 05/28/2018 IMPRESSION: Focal decreased attenuation in the right mid to superior frontal lobe, suspicious for early infarct in this area. No other findings which are felt to be indicative of acute infarct. There is age related volume loss with slight periventricular small vessel disease. No mass or hemorrhage evident. Mild calcification in the right carotid siphon noted. Extensive paranasal sinus disease noted, most notably in the right sphenoid sinus.   MR Brain Wo Contrast Result Date: 05/28/2018 IMPRESSION: Nonhemorrhagic acute infarction affecting the RIGHT insula and RIGHT frontal lobe, primarily cortical in nature. Atrophy and small vessel disease. No proximal large vessel occlusion is evident.   Jonathan ShoalsHannah Dyer Klug, DO [facility], PGY-1 05/29/2018 7:39 AM FPTS Intern pager: [phone], text pages welcome

## 2018-05-28 NOTE — ED Notes (Signed)
Pt tolerated sandwich w/o difficulty. Belongings placed in bag and moved to F5. Wife present at bedside.

## 2018-05-28 NOTE — ED Triage Notes (Signed)
Patient complains of slurred speech, left sided facial droop that he woke up with, last known well yesterday when at he went to bed at 2300. Wife reports similar episode at 1500 that lasted about 90 minutes and resolved on its own. Denies vision changes, alert and oriented, no arm weakness, grip strength equal bilaterally, no sensation changes.

## 2018-05-28 NOTE — H&P (Signed)
[facility] Pager: [phone]  Patient name: Jonathan FergusonJames Nicklaus Jr. Medical record number: [id number] Date of birth: 06/17/1936 Age: 82 y.o. Gender: male  Primary Care Provider: Patient, No Pcp Per Consultants: Neuro Code Status: Full  Chief Complaint: facial droop, slurred speech  Assessment and Plan: Jonathan FergusonJames Standiford Jr. is a 82 y.o. male presenting with facial droop and dysarthria. PMH is significant for hypertension, T2DM, GERD, and Hip arthroplasty.   Facial droop/dysarthria: likely secondary to new acute ischemic stroke seen on CT. -place in observation, attending Dr. McDiarmid -monitor on telemetry -neurology consulting, appreciate recommendations -MRI obtained -repeat echo -risk stratification labs: A1C lipid panel and TSH -start atorva 80 mg daily -high dose aspirin -permissive HTN, treat SBP if >220 -PT/OT eval -SLP eval  HTN: on problem list, no antihypertensive on home med list -monitor BP -permissive HTN -would treat SBP if > 220  HLD: on problem list, no statin. Last LDL in EMR 86 from July 2014. -repeat lipid panel -atorvastatin 80 mg daily  Glaucoma: chronic, stable -continue home eye drops  T2DM: on problem list. Last A1C 2014 elevated to 7.3 -repeat A1C -control sugars if elevated  Hx enlarged prostate: elevated PSA - Previously 6.13 > 6.66 > 8.75 (05/27/2013) - Will seek further clarification  FEN/GI: NPO until passes swallow screen Prophylaxis: SCD's  Disposition: place in tele obvs  History of Present Illness:  Jonathan FergusonJames Vicuna Jr. is a 82 y.o. male presenting with facial droop and speech change.  He had a similar episode yesterday involving left facial droop and left arm numbness that lasted an hour. At the time the patient's wife suggested they go to the hospital but he declined. The episode resolved on its own. He went bowling that afternoon and then went to sleep. When he awoke this  morning he had left-sided facial droop and slurred speech so he came to the ED.   Review Of Systems: Per HPI with the following additions:   Review of Systems  Constitutional: Negative for chills and fever.  HENT: Positive for hearing loss (chronic, uses hearing). Negative for congestion, sinus pain and tinnitus.   Eyes: Negative for blurred vision and double vision.  Respiratory: Negative for cough, hemoptysis and shortness of breath.   Cardiovascular: Negative for chest pain and palpitations.  Gastrointestinal: Negative for abdominal pain, blood in stool, constipation, diarrhea, heartburn and nausea.  Genitourinary: Positive for frequency. Negative for dysuria and urgency.  Musculoskeletal: Negative for myalgias.  Skin: Negative for itching and rash.  Neurological: Positive for speech change and focal weakness. Negative for dizziness, loss of consciousness, weakness and headaches.  Endo/Heme/Allergies: Does not bruise/bleed easily.  Psychiatric/Behavioral: Negative for depression and memory loss.   Patient Active Problem List   Diagnosis Date Noted  . Stroke (HCC) 05/28/2018  . Hip arthritis 02/08/2014  . Primary osteoarthritis of left hip 05/27/2013  . Dementia arising in the senium and presenium 05/27/2013  . Type II or unspecified type diabetes mellitus without mention of complication, uncontrolled 12/04/2011  . Pure hypercholesterolemia 10/30/2011  . Glaucoma 10/30/2011  . Routine general medical examination at a health care facility 10/30/2011  . PSA, INCREASED 11/26/2010  . HYPERTENSION 09/24/2010  . GERD 09/24/2010  . HYPERTROPHY PROSTATE W/UR OBST & OTH LUTS 09/24/2010   Past Medical History: Past Medical History:  Diagnosis Date  . Arthritis   . BPH (benign prostatic hyperplasia)    but not on any meds  . Diabetes mellitus    only  takes Cinnamon  . GERD (gastroesophageal reflux disease)   . Glaucoma    uses eye drops daily  . Hyperlipidemia    was on med but  has been off for a yr-medical md is aware  . Hypertension    was on med but has been off a yr-medical md is aware  . Joint swelling   . Primary osteoarthritis of left hip 05/27/2013  . Urinary frequency    Past Surgical History: Past Surgical History:  Procedure Laterality Date  . CARDIAC CATHETERIZATION  2011  . COLONOSCOPY    . patella tendons broke Bilateral   . ROTATOR CUFF REPAIR Right   . SHOULDER ARTHROSCOPY WITH BICEPS TENDON REPAIR Right   . TOTAL HIP ARTHROPLASTY Left 02/08/2014   DR LANDAU  . TOTAL HIP ARTHROPLASTY Left 02/08/2014   Procedure: TOTAL HIP ARTHROPLASTY;  Surgeon: Eulas Post, MD;  Location: [facility] OR;  Service: Orthopedics;  Laterality: Left;    Social History: Social History   Tobacco Use  . Smoking status: Never Smoker  . Smokeless tobacco: Never Used  Substance Use Topics  . Alcohol use: No  . Drug use: No   Family History: Family History  Problem Relation Age of Onset  . Kidney disease Other   . Diabetes Father   . Cancer Neg Hx   . Early death Neg Hx   . Heart disease Neg Hx   . Hypertension Neg Hx   . Hyperlipidemia Neg Hx   . Stroke Neg Hx    Allergies and Medications: No Known Allergies No current facility-administered medications on file prior to encounter.    Current Outpatient Medications on File Prior to Encounter  Medication Sig Dispense Refill  . aspirin 81 MG tablet Take 81 mg by mouth daily.      . bimatoprost (LUMIGAN) 0.03 % ophthalmic solution Place 1 drop into both eyes at bedtime.    . brimonidine-timolol (COMBIGAN) 0.2-0.5 % ophthalmic solution Place 1 drop into both eyes every 12 (twelve) hours.    Marland Kitchen CINNAMON PO Take 2 capsules by mouth daily.    . Glucosamine-Chondroitin (GLUCOSAMINE CHONDR COMPLEX PO) Take by mouth.      Marland Kitchen ibuprofen (ADVIL,MOTRIN) 200 MG tablet Take 400 mg by mouth every 6 (six) hours as needed.    . Multiple Vitamin (MULTIVITAMIN) tablet Take 1 tablet by mouth daily.      . baclofen (LIORESAL) 10  MG tablet Take 1 tablet (10 mg total) by mouth 3 (three) times daily. As needed for muscle spasm (Patient not taking: Reported on 05/28/2018) 50 each 0  . HYDROcodone-acetaminophen (NORCO) 10-325 MG per tablet Take 1-2 tablets by mouth every 6 (six) hours as needed. (Patient not taking: Reported on 05/28/2018) 75 tablet 0  . rivaroxaban (XARELTO) 10 MG TABS tablet Take 1 tablet (10 mg total) by mouth daily. (Patient not taking: Reported on 05/28/2018) 21 tablet 0  . sennosides-docusate sodium (SENOKOT-S) 8.6-50 MG tablet Take 2 tablets by mouth daily. (Patient not taking: Reported on 05/28/2018) 30 tablet 1   Objective: BP (!) 171/87   Pulse 62   Temp 97.9 F (36.6 C)   Resp 14   SpO2 98%  Physical Exam  Constitutional: He is oriented to person, place, and time. He appears well-developed and well-nourished.  HENT:  Head: Normocephalic and atraumatic.  Eyes: Conjunctivae and EOM are normal.  Neck: Normal range of motion. Neck supple. No tracheal deviation present. No thyromegaly present.  Cardiovascular: Normal rate, regular rhythm, normal heart  sounds and intact distal pulses. Exam reveals no gallop and no friction rub.  No murmur heard. Pulmonary/Chest: Effort normal and breath sounds normal. No stridor. No respiratory distress.  Abdominal: Soft. Bowel sounds are normal. He exhibits no distension. There is no tenderness. There is no rebound and no guarding.  Musculoskeletal: Normal range of motion. He exhibits no edema or deformity.  Neurological: He is alert and oriented to person, place, and time. A cranial nerve deficit (7th CN decreased smile on L; otherwise CN 2-12 intact ) is present. No sensory deficit. He exhibits normal muscle tone. Coordination normal.  Skin: Skin is warm and dry. Capillary refill takes less than 2 seconds. No erythema. No pallor.  Psychiatric: He has a normal mood and affect. His behavior is normal. Judgment and thought content normal.   Labs and Imaging: CBC BMET   Recent Labs  Lab 05/28/18 0956  WBC 8.6  HGB 12.8*  HCT 39.8  PLT 197   Recent Labs  Lab 05/28/18 0956  NA 139  K 4.0  CL 107  CO2 23  BUN 12  CREATININE 1.27*  GLUCOSE 174*  CALCIUM 9.6     Ct Head Wo Contrast  Result Date: 05/28/2018 CLINICAL DATA:  Slurred speech with left-sided facial droop EXAM: CT HEAD WITHOUT CONTRAST TECHNIQUE: Contiguous axial images were obtained from the base of the skull through the vertex without intravenous contrast. COMPARISON:  None. FINDINGS: Brain: There is age related volume loss. There is no mass, hemorrhage, extra-axial fluid collection, or midline shift. There is focal decreased attenuation in the posterior mid to superior right frontal lobe, an appearance suspicious for acute infarct. This appearance is best seen on axial slice 20 series 3, coronal slice 35 series 5, and sagittal slice 16 series 6. Elsewhere, there is mild small vessel disease in the centra semiovale bilaterally. Small foci of basal ganglia calcification are likely physiologic. Vascular: There is no appreciable hyperdense vessel. There is mild calcification in the right carotid siphon region. Skull: Bony calvarium appears intact. Sinuses/Orbits: There is extensive opacification in the right sphenoid sinus region. There is mucosal thickening and opacification in several ethmoid air cells bilaterally. Visualized orbits appear symmetric bilaterally. Other: Visualized mastoid air cells are clear. IMPRESSION: Focal decreased attenuation in the right mid to superior frontal lobe, suspicious for early infarct in this area. No other findings which are felt to be indicative of acute infarct. There is age related volume loss with slight periventricular small vessel disease. No mass or hemorrhage evident. Mild calcification in the right carotid siphon noted. Extensive paranasal sinus disease noted, most notably in the right sphenoid sinus. These results were called by telephone at the time of  interpretation on 05/28/2018 at 11:57 am to Dr. Gerhard Munch , who verbally acknowledged these results. Electronically Signed   By: Bretta Bang III M.D.   On: 05/28/2018 11:57   Mr Brain Wo Contrast  Result Date: 05/28/2018 CLINICAL DATA:  Dysarthria, and LEFT facial droop upon awakening. EXAM: MRI HEAD WITHOUT CONTRAST TECHNIQUE: Multiplanar, multiecho pulse sequences of the brain and surrounding structures were obtained without intravenous contrast. COMPARISON:  CT head earlier today. FINDINGS: The patient would not allow completion of the scan. Axial T1 weighted images were omitted. Overall the study is diagnostic. Brain: Multifocal areas of restricted diffusion, RIGHT MCA distribution, affect the RIGHT insula, frontal operculum, as well as the prefrontal gyrus over the convexity, corresponding low ADC, consistent with acute infarction. These are primarily cortical in nature, although some  subcortical white matter may be affected. No similar findings on the LEFT or in the posterior circulation. No hemorrhage, mass lesion, hydrocephalus, or extra-axial fluid. Cerebral and cerebellar atrophy. Mild subcortical and periventricular T2 and FLAIR hyperintensities, likely chronic microvascular ischemic change. Small BILATERAL chronic thalamic infarcts. Vascular: Flow voids are maintained. There are no areas of chronic hemorrhage. Skull and upper cervical spine: Normal marrow signal. Sinuses/Orbits: Negative. Other: Compared with recent CT, there is good general agreement. IMPRESSION: Nonhemorrhagic acute infarction affecting the RIGHT insula and RIGHT frontal lobe, primarily cortical in nature. Atrophy and small vessel disease. No proximal large vessel occlusion is evident. Electronically Signed   By: Elsie Stain M.D.   On: 05/28/2018 15:26    Tillman Sers, DO 05/28/2018, 3:22 PM PGY-3, [facility] FPTS Intern pager: [phone], text pages welcome

## 2018-05-28 NOTE — ED Triage Notes (Signed)
PT now denies numbness in LT hand

## 2018-05-29 ENCOUNTER — Ambulatory Visit (HOSPITAL_BASED_OUTPATIENT_CLINIC_OR_DEPARTMENT_OTHER): Payer: Medicare Other

## 2018-05-29 ENCOUNTER — Observation Stay (HOSPITAL_COMMUNITY): Payer: Medicare Other

## 2018-05-29 DIAGNOSIS — R972 Elevated prostate specific antigen [PSA]: Secondary | ICD-10-CM | POA: Diagnosis present

## 2018-05-29 DIAGNOSIS — Z96642 Presence of left artificial hip joint: Secondary | ICD-10-CM | POA: Diagnosis present

## 2018-05-29 DIAGNOSIS — Q211 Atrial septal defect: Secondary | ICD-10-CM | POA: Diagnosis not present

## 2018-05-29 DIAGNOSIS — I6389 Other cerebral infarction: Secondary | ICD-10-CM | POA: Diagnosis not present

## 2018-05-29 DIAGNOSIS — I10 Essential (primary) hypertension: Secondary | ICD-10-CM | POA: Diagnosis not present

## 2018-05-29 DIAGNOSIS — I361 Nonrheumatic tricuspid (valve) insufficiency: Secondary | ICD-10-CM

## 2018-05-29 DIAGNOSIS — M7989 Other specified soft tissue disorders: Secondary | ICD-10-CM | POA: Diagnosis not present

## 2018-05-29 DIAGNOSIS — Z79899 Other long term (current) drug therapy: Secondary | ICD-10-CM | POA: Diagnosis not present

## 2018-05-29 DIAGNOSIS — R471 Dysarthria and anarthria: Secondary | ICD-10-CM | POA: Diagnosis present

## 2018-05-29 DIAGNOSIS — M79609 Pain in unspecified limb: Secondary | ICD-10-CM | POA: Diagnosis not present

## 2018-05-29 DIAGNOSIS — R402252 Coma scale, best verbal response, oriented, at arrival to emergency department: Secondary | ICD-10-CM | POA: Diagnosis present

## 2018-05-29 DIAGNOSIS — R402142 Coma scale, eyes open, spontaneous, at arrival to emergency department: Secondary | ICD-10-CM | POA: Diagnosis present

## 2018-05-29 DIAGNOSIS — E119 Type 2 diabetes mellitus without complications: Secondary | ICD-10-CM | POA: Diagnosis present

## 2018-05-29 DIAGNOSIS — Z833 Family history of diabetes mellitus: Secondary | ICD-10-CM | POA: Diagnosis not present

## 2018-05-29 DIAGNOSIS — I639 Cerebral infarction, unspecified: Secondary | ICD-10-CM | POA: Diagnosis not present

## 2018-05-29 DIAGNOSIS — R4781 Slurred speech: Secondary | ICD-10-CM | POA: Diagnosis present

## 2018-05-29 DIAGNOSIS — H409 Unspecified glaucoma: Secondary | ICD-10-CM | POA: Diagnosis present

## 2018-05-29 DIAGNOSIS — R402362 Coma scale, best motor response, obeys commands, at arrival to emergency department: Secondary | ICD-10-CM | POA: Diagnosis present

## 2018-05-29 DIAGNOSIS — I6340 Cerebral infarction due to embolism of unspecified cerebral artery: Secondary | ICD-10-CM | POA: Diagnosis present

## 2018-05-29 DIAGNOSIS — E785 Hyperlipidemia, unspecified: Secondary | ICD-10-CM | POA: Diagnosis present

## 2018-05-29 DIAGNOSIS — R29810 Facial weakness: Secondary | ICD-10-CM | POA: Diagnosis not present

## 2018-05-29 DIAGNOSIS — E1149 Type 2 diabetes mellitus with other diabetic neurological complication: Secondary | ICD-10-CM | POA: Diagnosis not present

## 2018-05-29 DIAGNOSIS — Z7982 Long term (current) use of aspirin: Secondary | ICD-10-CM | POA: Diagnosis not present

## 2018-05-29 DIAGNOSIS — I340 Nonrheumatic mitral (valve) insufficiency: Secondary | ICD-10-CM | POA: Diagnosis not present

## 2018-05-29 DIAGNOSIS — R29702 NIHSS score 2: Secondary | ICD-10-CM | POA: Diagnosis present

## 2018-05-29 DIAGNOSIS — I63311 Cerebral infarction due to thrombosis of right middle cerebral artery: Secondary | ICD-10-CM | POA: Diagnosis not present

## 2018-05-29 DIAGNOSIS — I6300 Cerebral infarction due to thrombosis of unspecified precerebral artery: Secondary | ICD-10-CM | POA: Diagnosis not present

## 2018-05-29 LAB — LIPID PANEL
Cholesterol: 117 mg/dL (ref 0–200)
HDL: 24 mg/dL — AB (ref >40)
LDL Cholesterol: 68 mg/dL (ref 0–99)
Total CHOL/HDL Ratio: 4.9 RATIO
Triglycerides: 124 mg/dL (ref <150)
VLDL: 25 mg/dL (ref 0–40)

## 2018-05-29 LAB — BASIC METABOLIC PANEL
ANION GAP: 7 (ref 5–15)
BUN: 12 mg/dL (ref 8–23)
CO2: 26 mmol/L (ref 22–32)
Calcium: 9.2 mg/dL (ref 8.9–10.3)
Chloride: 108 mmol/L (ref 98–111)
Creatinine, Ser: 1.23 mg/dL (ref 0.61–1.24)
GFR calc Af Amer: >60 mL/min (ref >60)
GFR calc non Af Amer: 54 mL/min — ABNORMAL LOW (ref >60)
GLUCOSE: 122 mg/dL — AB (ref 70–99)
POTASSIUM: 4.2 mmol/L (ref 3.5–5.1)
Sodium: 141 mmol/L (ref 135–145)

## 2018-05-29 LAB — CBC
HEMATOCRIT: 35.8 % — AB (ref 39.0–52.0)
Hemoglobin: 11.8 g/dL — ABNORMAL LOW (ref 13.0–17.0)
MCH: 28.2 pg (ref 26.0–34.0)
MCHC: 33 g/dL (ref 30.0–36.0)
MCV: 85.6 fL (ref 78.0–100.0)
Platelets: 185 10*3/uL (ref 150–400)
RBC: 4.18 MIL/uL — AB (ref 4.22–5.81)
RDW: 14.3 % (ref 11.5–15.5)
WBC: 7.9 10*3/uL (ref 4.0–10.5)

## 2018-05-29 LAB — GLUCOSE, CAPILLARY
GLUCOSE-CAPILLARY: 124 mg/dL — AB (ref 70–99)
GLUCOSE-CAPILLARY: 142 mg/dL — AB (ref 70–99)
GLUCOSE-CAPILLARY: 96 mg/dL (ref 70–99)
Glucose-Capillary: 113 mg/dL — ABNORMAL HIGH (ref 70–99)
Glucose-Capillary: 110 mg/dL — ABNORMAL HIGH (ref 70–99)

## 2018-05-29 LAB — HEMOGLOBIN A1C
HEMOGLOBIN A1C: 6.6 % — AB (ref 4.8–5.6)
MEAN PLASMA GLUCOSE: 142.72 mg/dL

## 2018-05-29 LAB — ECHOCARDIOGRAM COMPLETE
Height: 65 in
Weight: 2306.8934 oz

## 2018-05-29 LAB — TSH: TSH: 4.873 u[IU]/mL — ABNORMAL HIGH (ref 0.350–4.500)

## 2018-05-29 MED ORDER — ASPIRIN EC 81 MG PO TBEC
81.0000 mg | DELAYED_RELEASE_TABLET | Freq: Every day | ORAL | Status: DC
  Administered 2018-05-30 – 2018-06-01 (×3): 81 mg via ORAL
  Filled 2018-05-29 (×3): qty 1

## 2018-05-29 MED ORDER — CLOPIDOGREL BISULFATE 75 MG PO TABS
75.0000 mg | ORAL_TABLET | Freq: Every day | ORAL | Status: DC
  Administered 2018-05-30 – 2018-06-01 (×3): 75 mg via ORAL
  Filled 2018-05-29 (×3): qty 1

## 2018-05-29 MED ORDER — ASPIRIN EC 81 MG PO TBEC: 81.0000 mg | DELAYED_RELEASE_TABLET | Freq: Every day | ORAL | Status: DC

## 2018-05-29 MED ORDER — ATORVASTATIN CALCIUM 10 MG PO TABS
10.0000 mg | ORAL_TABLET | Freq: Every day | ORAL | Status: DC
  Administered 2018-05-29 – 2018-06-01 (×4): 10 mg via ORAL
  Filled 2018-05-29 (×4): qty 1

## 2018-05-29 MED ORDER — CLOPIDOGREL BISULFATE 75 MG PO TABS: 75.0000 mg | ORAL_TABLET | Freq: Every day | ORAL | Status: DC

## 2018-05-29 MED ORDER — IOPAMIDOL (ISOVUE-370) INJECTION 76%
50.0000 mL | Freq: Once | INTRAVENOUS | Status: AC | PRN
  Administered 2018-05-29: 50 mL via INTRAVENOUS

## 2018-05-29 NOTE — Progress Notes (Addendum)
STROKE TEAM PROGRESS NOTE   SUBJECTIVE (INTERVAL HISTORY) His wife is at the bedside.  Pt sitting at edge of bed, still has left facial droop and mild slurred speech.  Left hand numbness resolved already.  MRI showed right insular cortex and right frontal infarct, embolic pattern.  Agree with TEE and loop recorder on Monday.  OBJECTIVE Temp:  [98 F (36.7 C)-98.3 F (36.8 C)] 98.2 F (36.8 C) (07/19 1245) Pulse Rate:  [53-66] 53 (07/19 1245) Cardiac Rhythm: Sinus bradycardia (07/19 0812) Resp:  [16-23] 20 (07/19 1245) BP: (119-186)/(65-92) 128/71 (07/19 1245) SpO2:  [97 %-100 %] 100 % (07/19 1245) Weight:  [144 lb 2.9 oz (65.4 kg)] 144 lb 2.9 oz (65.4 kg) (07/18 1819)  CBC:  Recent Labs  Lab 05/28/18 0956 05/29/18 0353  WBC 8.6 7.9  NEUTROABS 5.8  --   HGB 12.8* 11.8*  HCT 39.8 35.8*  MCV 87.1 85.6  PLT 197 185    Basic Metabolic Panel:  Recent Labs  Lab 05/28/18 0956 05/29/18 0353  NA 139 141  K 4.0 4.2  CL 107 108  CO2 23 26  GLUCOSE 174* 122*  BUN 12 12  CREATININE 1.27* 1.23  CALCIUM 9.6 9.2    Lipid Panel:     Component Value Date/Time   CHOL 117 05/29/2018 0353   TRIG 124 05/29/2018 0353   HDL 24 (L) 05/29/2018 0353   CHOLHDL 4.9 05/29/2018 0353   VLDL 25 05/29/2018 0353   LDLCALC 68 05/29/2018 0353   HgbA1c:  Lab Results  Component Value Date   HGBA1C 6.6 (H) 05/29/2018   Urine Drug Screen: No results found for: LABOPIA, COCAINSCRNUR, LABBENZ, AMPHETMU, THCU, LABBARB  Alcohol Level No results found for: [facility]  IMAGING I have personally reviewed the radiological images below and agree with the radiology interpretations.  Ct Angio Head W Or Wo Contrast Ct Angio Neck W Or Wo Contrast 05/29/2018 IMPRESSION:  1. Patent carotid and vertebral arteries. No dissection, aneurysm, or hemodynamically significant stenosis utilizing NASCET criteria.  2. Patent anterior and posterior intracranial circulation. No large vessel occlusion, aneurysm, or  significant stenosis.  3. Non stenotic beaded irregularity of right mid cervical ICA may be due to atherosclerotic changes or fibromuscular dysplasia.   Ct Head Wo Contrast 05/28/2018 IMPRESSION:  Focal decreased attenuation in the right mid to superior frontal lobe, suspicious for early infarct in this area. No other findings which are felt to be indicative of acute infarct. There is age related volume loss with slight periventricular small vessel disease. No mass or hemorrhage evident. Mild calcification in the right carotid siphon noted. Extensive paranasal sinus disease noted, most notably in the right sphenoid sinus.   Mr Brain Wo Contrast 05/28/2018 IMPRESSION: Nonhemorrhagic acute infarction affecting the RIGHT insula and RIGHT frontal lobe, primarily cortical in nature. Atrophy and small vessel disease. No proximal large vessel occlusion is evident.   Transthoracic Echocardiogram 05/20/2018 Study Conclusions - Left ventricle: The cavity size was normal. There was moderate   concentric hypertrophy. Systolic function was vigorous. The   estimated ejection fraction was in the range of 65% to 70%. Wall   motion was normal; there were no regional wall motion   abnormalities. Doppler parameters are consistent with abnormal   left ventricular relaxation (grade 1 diastolic dysfunction).   There was no evidence of elevated ventricular filling pressure by   Doppler parameters. - Aortic valve: Valve area (VTI): 2.31 cm^2. Valve area (Vmax):   2.54 cm^2. Valve area (Vmean): 2.28 cm^2. -  Aortic root: The aortic root was normal in size. - Mitral valve: There was mild regurgitation. - Right ventricle: Systolic function was normal. - Tricuspid valve: There was mild regurgitation. - Pulmonary arteries: Systolic pressure was within the normal   range. - Inferior vena cava: The vessel was normal in size. - Pericardium, extracardiac: There was no pericardial effusion. Impressions: - No cardiac  source of emboli was indentified.  TEE pending  PHYSICAL EXAM  Temp:  [98 F (36.7 C)-98.5 F (36.9 C)] 98.5 F (36.9 C) (07/19 1635) Pulse Rate:  [53-65] 56 (07/19 1635) Resp:  [18-20] 20 (07/19 1635) BP: (119-173)/(65-89) 150/81 (07/19 1635) SpO2:  [97 %-100 %] 97 % (07/19 1635) Weight:  [144 lb 2.9 oz (65.4 kg)] 144 lb 2.9 oz (65.4 kg) (07/18 1819)  General - Well nourished, well developed, in no apparent distress.  Ophthalmologic - fundi not visualized due to noncooperation.  Cardiovascular - Regular rate and rhythm with no murmur.  Mental Status -  Level of arousal and orientation to time, place, and person were intact. Language including expression, naming, repetition, comprehension was assessed and found intact.  Mild dysarthria Fund of Knowledge was assessed and was intact.  Cranial Nerves II - XII - II - Visual field intact OU. III, IV, VI - Extraocular movements intact. V - Facial sensation intact bilaterally. VII - left facial droop. VIII - Hearing & vestibular intact bilaterally. X - Palate elevates symmetrically, mild dysarthria. XI - Chin turning & shoulder shrug intact bilaterally. XII - Tongue protrusion intact.  Motor Strength - The patient's strength was normal in all extremities and pronator drift was absent.  Bulk was normal and fasciculations were absent.   Motor Tone - Muscle tone was assessed at the neck and appendages and was normal.  Reflexes - The patient's reflexes were symmetrical in all extremities and he had no pathological reflexes.  Sensory - Light touch, temperature/pinprick were assessed and were symmetrical.    Coordination - The patient had normal movements in the hands and feet with no ataxia or dysmetria.  Tremor was absent.  Gait and Station - deferred.  ASSESSMENT/PLAN [name]. is a 82 y.o. male with history of diabetes mellitus, hypertension, hyperlipidemia, and arthritis presenting with left facial droop,  dysarthria, and left hand numbness. He did not receive IV t-PA due to late presentation.  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  Resultant left facial droop and mild dysarthria  CT head - Focal decreased attenuation in the right mid to superior frontal lobe, suspicious for early infarct in this area.  MRI head - Nonhemorrhagic acute infarction affecting the RIGHT insula and RIGHT frontal lobe, primarily cortical in nature.  CTA H&N - unremarkable.   2D Echo - EF 65 to 70%  Pt would like to stay for TEE and loop recorder on Monday for cardioembolic work-up  LDL - 68  HgbA1c - 6.6  VTE prophylaxis - SCDs  aspirin 81 mg daily prior to admission, now on aspirin 81 and Plavix 75 DAPT for 3 months and then Plavix alone.  Patient counseled to be compliant with his antithrombotic medications  Ongoing aggressive stroke risk factor management  Therapy recommendations:  pending  Disposition:  Pending  Hypertension  Stable .  Permissive hypertension (OK if < 220/120) but gradually normalize in 5-7 days .  Long-term BP goal normotensive  Hyperlipidemia  Lipid lowering medication PTA:  none  LDL 68, goal <  70  Current lipid lowering medication: Lipitor 10 mg daily  Continue statin at discharge  Diabetes  HgbA1c 6.6, goal < 7.0  Controlled  SSI  CBG  Other Stroke Risk Factors  Advanced age  ETOH use, advised to drink no more than 1 alcoholic beverage per day.  Other Active Problems    Hospital day # 0  Marvel Plan, MD PhD Stroke Neurology 05/29/2018 5:40 PM  To contact Stroke Continuity provider, please refer to [email or web address]. After hours, contact General Neurology

## 2018-05-29 NOTE — Evaluation (Signed)
Occupational Therapy Evaluation and Discharge Patient Details Name: Jonathan Daniels. MRN: [id number] DOB: Apr 03, 1936 Today's Date: 05/29/2018    History of Present Illness Jonathan Danielsis a 82 y.o.malepresenting with facial droop and dysarthria, L hand numbness. PMH is significant forhypertension, T2DM, GERD, and Hip arthroplasty. MRI revealed Nonhemorrhagic acute infarction affecting the RIGHT insula and RIGHT frontal lobe, primarily cortical in nature.   Clinical Impression   PTA Pt independent in ADL and mobility. Pt is currently at baseline for ADL. Pt abe to demonstrate LB dressing, toilet transfer, peri care, sink level grooming, all without any assist either physically or through cues. Pt has no questions or concerns. L hand sensation has returned and is functional for tasks and fine motor skills. OT to sign off at this time. Thank you for the opportunity to serve this patient.    Follow Up Recommendations  No OT follow up;Supervision - Intermittent    Equipment Recommendations  None recommended by OT    Recommendations for Other Services       Precautions / Restrictions Precautions Precautions: Fall Restrictions Weight Bearing Restrictions: No      Mobility Bed Mobility Overal bed mobility: Needs Assistance Bed Mobility: Supine to Sit;Sit to Supine     Supine to sit: Supervision Sit to supine: Supervision   General bed mobility comments: for safety  Transfers Overall transfer level: Needs assistance Equipment used: None Transfers: Sit to/from Stand Sit to Stand: Min guard         General transfer comment: for immediate standing balance    Balance Overall balance assessment: Needs assistance Sitting-balance support: No upper extremity supported;Feet supported Sitting balance-Leahy Scale: Good     Standing balance support: No upper extremity supported;During functional activity Standing balance-Leahy Scale: Fair                            ADL either performed or assessed with clinical judgement   ADL Overall ADL's : At baseline                                             Vision Baseline Vision/History: Wears glasses Vision Assessment?: Yes Eye Alignment: Within Functional Limits Ocular Range of Motion: Within Functional Limits Alignment/Gaze Preference: Within Defined Limits Tracking/Visual Pursuits: Able to track stimulus in all quads without difficulty Convergence: Within functional limits Visual Fields: No apparent deficits     Perception     Praxis      Pertinent Vitals/Pain Pain Assessment: No/denies pain     Hand Dominance Right   Extremity/Trunk Assessment Upper Extremity Assessment Upper Extremity Assessment: Generalized weakness(baseline limited ROM in LUE (approx 75 degrees shoulder FF))   Lower Extremity Assessment Lower Extremity Assessment: Defer to PT evaluation       Communication Communication Communication: HOH   Cognition Arousal/Alertness: Awake/alert Behavior During Therapy: WFL for tasks assessed/performed Overall Cognitive Status: Within Functional Limits for tasks assessed                                     General Comments       Exercises     Shoulder Instructions      Home Living Family/patient expects to be discharged to:: Private residence Living Arrangements: Spouse/significant other   Type of Home:  House Home Access: Stairs to enter Entergy CorporationEntrance Stairs-Number of Steps: 3 Entrance Stairs-Rails: Left Home Layout: Two level;Able to live on main level with bedroom/bathroom Alternate Level Stairs-Number of Steps: 13   Bathroom Shower/Tub: Producer, television/film/videoWalk-in shower   Bathroom Toilet: Standard Bathroom Accessibility: Yes How Accessible: Accessible via walker Home Equipment: Gilmer MorCane - single point      Lives With: Spouse    Prior Functioning/Environment Level of Independence: Independent with assistive device(s)        Comments:  intermittent use of SPC        OT Problem List:        OT Treatment/Interventions:      OT Goals(Current goals can be found in the care plan section) Acute Rehab OT Goals Patient Stated Goal: "get home" OT Goal Formulation: With patient Time For Goal Achievement: 06/10/18 Potential to Achieve Goals: Good  OT Frequency:     Barriers to D/C:            Co-evaluation              AM-PAC PT "6 Clicks" Daily Activity     Outcome Measure Help from another person eating meals?: None Help from another person taking care of personal grooming?: None Help from another person toileting, which includes using toliet, bedpan, or urinal?: None Help from another person bathing (including washing, rinsing, drying)?: A Little Help from another person to put on and taking off regular upper body clothing?: None Help from another person to put on and taking off regular lower body clothing?: None 6 Click Score: 23   End of Session Equipment Utilized During Treatment: Gait belt Nurse Communication: Mobility status  Activity Tolerance: Patient tolerated treatment well Patient left: in bed;with call bell/phone within reach;with bed alarm set                   Time: 9562-13081112-1131 OT Time Calculation (min): 19 min Charges:  OT General Charges $OT Visit: 1 Visit OT Evaluation $OT Eval Low Complexity: 1 Low G-Codes:     Jonathan MangesLaura Charlett Daniels OTR/L [phone]  Evern BioLaura J Froylan Daniels 05/29/2018, 2:52 PM

## 2018-05-29 NOTE — Evaluation (Signed)
Physical Therapy Evaluation Patient Details Name: Jonathan FergusonJames Swartzendruber Daniels. MRN: [id number] DOB: 03/23/1936 Today's Date: 05/29/2018   History of Present Illness  Jonathan Danielsis a 82 y.o.malepresenting with facial droop and dysarthria, L hand numbness. PMH is significant forhypertension, T2DM, GERD, and Hip arthroplasty. MRI revealed Nonhemorrhagic acute infarction affecting the RIGHT insula and RIGHT frontal lobe, primarily cortical in nature.    Clinical Impression  Jonathan Daniels is a pleasant 82 y/o male admitted with the above listed diagnosis. Patient reporting that prior to admission, he lived at home with his wife and was Mod I with mobility. Patient today requiring Min guard for all mobility for safety. Unsteadiness with gait this morning, but without LOB, or physical assist for stability. Will recommend outpatient PT at discharge to continue to progress safe functional mobility for reduced fall risk. PT to continue to follow acutely.     Follow Up Recommendations Outpatient PT    Equipment Recommendations  None recommended by PT    Recommendations for Other Services       Precautions / Restrictions Precautions Precautions: Fall Restrictions Weight Bearing Restrictions: No      Mobility  Bed Mobility Overal bed mobility: Needs Assistance Bed Mobility: Supine to Sit     Supine to sit: Supervision     General bed mobility comments: for safety  Transfers Overall transfer level: Needs assistance Equipment used: None Transfers: Sit to/from Stand Sit to Stand: Min guard         General transfer comment: for immediate standing balance  Ambulation/Gait Ambulation/Gait assistance: Min guard Gait Distance (Feet): 160 Feet Assistive device: None Gait Pattern/deviations: Step-through pattern;Decreased stride length;Narrow base of support;Drifts right/left;Trunk flexed Gait velocity: decreased   General Gait Details: slight unsteadiness with gait; patient preferring to not  use SPC this morning; Min guard for safety  Stairs Stairs: Yes Stairs assistance: Min guard Stair Management: Two rails;Alternating pattern Number of Stairs: 3    Wheelchair Mobility    Modified Rankin (Stroke Patients Only) Modified Rankin (Stroke Patients Only) Pre-Morbid Rankin Score: No symptoms Modified Rankin: Moderately severe disability     Balance Overall balance assessment: Needs assistance Sitting-balance support: No upper extremity supported;Feet supported Sitting balance-Leahy Scale: Good     Standing balance support: No upper extremity supported;During functional activity Standing balance-Leahy Scale: Fair                   Standardized Balance Assessment Standardized Balance Assessment : Dynamic Gait Index   Dynamic Gait Index Level Surface: Mild Impairment Change in Gait Speed: Moderate Impairment Gait with Horizontal Head Turns: Mild Impairment Gait with Vertical Head Turns: Mild Impairment Gait and Pivot Turn: Mild Impairment Step Over Obstacle: Mild Impairment Step Around Obstacles: Mild Impairment Steps: Mild Impairment Total Score: 15       Pertinent Vitals/Pain Pain Assessment: No/denies pain    Home Living Family/patient expects to be discharged to:: Private residence Living Arrangements: Spouse/significant other   Type of Home: House Home Access: Stairs to enter Entrance Stairs-Rails: Left Entrance Stairs-Number of Steps: 3 Home Layout: Two level;Able to live on main level with bedroom/bathroom Home Equipment: Gilmer Morane - single point      Prior Function Level of Independence: Independent with assistive device(s)         Comments: intermittent use of SPC     Hand Dominance   Dominant Hand: Right    Extremity/Trunk Assessment   Upper Extremity Assessment Upper Extremity Assessment: Defer to OT evaluation    Lower Extremity Assessment  Lower Extremity Assessment: Generalized weakness       Communication    Communication: HOH  Cognition Arousal/Alertness: Awake/alert Behavior During Therapy: WFL for tasks assessed/performed Overall Cognitive Status: Within Functional Limits for tasks assessed                                        General Comments      Exercises     Assessment/Plan    PT Assessment Patient needs continued PT services  PT Problem List Decreased strength;Decreased balance;Decreased activity tolerance;Decreased mobility;Decreased knowledge of use of DME;Decreased safety awareness;Decreased knowledge of precautions       PT Treatment Interventions DME instruction;Gait training;Stair training;Functional mobility training;Therapeutic activities;Therapeutic exercise;Balance training;Neuromuscular re-education;Patient/family education    PT Goals (Current goals can be found in the Care Plan section)  Acute Rehab PT Goals Patient Stated Goal: none stated PT Goal Formulation: With patient Time For Goal Achievement: 06/05/18 Potential to Achieve Goals: Good    Frequency Min 4X/week   Barriers to discharge        Co-evaluation               AM-PAC PT "6 Clicks" Daily Activity  Outcome Measure Difficulty turning over in bed (including adjusting bedclothes, sheets and blankets)?: A Lot Difficulty moving from lying on back to sitting on the side of the bed? : A Little Difficulty sitting down on and standing up from a chair with arms (e.g., wheelchair, bedside commode, etc,.)?: Unable Help needed moving to and from a bed to chair (including a wheelchair)?: A Little Help needed walking in hospital room?: A Little Help needed climbing 3-5 steps with a railing? : A Little 6 Click Score: 15    End of Session Equipment Utilized During Treatment: Gait belt Activity Tolerance: Patient tolerated treatment well Patient left: in chair;with call bell/phone within reach;with chair alarm set Nurse Communication: Mobility status PT Visit Diagnosis:  Unsteadiness on feet (R26.81);Other abnormalities of gait and mobility (R26.89);Muscle weakness (generalized) (M62.81)    Time: 1610-9604 PT Time Calculation (min) (ACUTE ONLY): 22 min   Charges:   PT Evaluation $PT Eval Moderate Complexity: 1 Mod     PT G Codes:         Kipp Laurence, PT, DPT 05/29/18 11:01 AM Pager: [phone]

## 2018-05-29 NOTE — Evaluation (Signed)
Speech Language Pathology Evaluation Patient Details Name: Jonathan FergusonJames Otterson Jr. MRN: [id number] DOB: 10/21/1936 Today's Date: 05/29/2018 Time: [id number] SLP Time Calculation (min) (ACUTE ONLY): 25 min  Problem List:  Patient Active Problem List   Diagnosis Date Noted  . Stroke (HCC) 05/28/2018  . Dysarthria due to acute cerebrovascular accident (CVA) (HCC) 05/28/2018  . 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 Hip arthritis 02/08/2014  . Primary osteoarthritis of left hip 05/27/2013  . Dementia arising in the senium and presenium 05/27/2013  . Diabetes mellitus type II, controlled (HCC) 12/04/2011  . Pure hypercholesterolemia 10/30/2011  . Glaucoma 10/30/2011  . PSA, INCREASED 11/26/2010  . Essential hypertension 09/24/2010  . GERD 09/24/2010  . HYPERTROPHY PROSTATE W/UR OBST & OTH LUTS 09/24/2010   Past Medical History:  Past Medical History:  Diagnosis Date  . Arthritis    "right knee" (05/28/2018)  . BPH (benign prostatic hyperplasia)    but not on any meds  . CVA (cerebral vascular accident) (HCC) 05/28/2018   "left mouth droopy" (05/28/2018)  . GERD (gastroesophageal reflux disease)   . Glaucoma    uses eye drops daily  . Hyperlipidemia    was on med but has been off for a yr-medical md is aware  . Hypertension    was on med but has been off a yr-medical md is aware  . Joint swelling   . Primary osteoarthritis of left hip 05/27/2013  . Type II diabetes mellitus (HCC)    only takes Cinnamon  . Urinary frequency    Past Surgical History:  Past Surgical History:  Procedure Laterality Date  . CARDIAC CATHETERIZATION  2011  . COLONOSCOPY    . JOINT REPLACEMENT    . PATELLAR TENDON REPAIR Bilateral   . SHOULDER OPEN ROTATOR CUFF REPAIR Right   . TOTAL HIP ARTHROPLASTY Left 02/08/2014   Procedure: TOTAL HIP ARTHROPLASTY;  Surgeon: Eulas PostJoshua P Landau, MD;  Location: [facility] OR;  Service: Orthopedics;  Laterality: Left;   HPI:  82 y.o. male,  [place] [employer] [place], presenting with facial droop and dysarthria. PMH is significant for hypertension, T2DM, GERD, and Hip arthroplasty. MRI: Nonhemorrhagic acute infarction affecting the RIGHT insula and RIGHT frontal lobe, primarily cortical in nature   Assessment / Plan / Recommendation Clinical Impression  Pt presents with a mild UUMN dysarthria s/p right cortical stroke.  Cognition, including attention, working memory, and insight are WFL.  Pt is active at baseline, has good social support, goes bowling five nights/week.  Recommend SLP evaluation for dysarthria after D/C, disposition pending OT/PT evaluations.  No further acute care SLP recommended.  Discussed with pt that speech deficits may resolve by time of repeat assessment, but I would rather he receive the referral should dysarthria not improve.  He agrees.     SLP Assessment  SLP Recommendation/Assessment: All further Speech Lanaguage Pathology  needs can be addressed in the next venue of care SLP Visit Diagnosis: Dysarthria and anarthria (R47.1)    Follow Up Recommendations  Outpatient SLP    Frequency and Duration           SLP Evaluation Cognition  Overall Cognitive Status: (P) Within Functional Limits for tasks assessed Arousal/Alertness: Awake/alert Orientation Level: Oriented X4 Attention: Alternating Alternating Attention: Appears intact Memory: Appears intact Awareness: Appears intact Problem Solving: Appears intact       Comprehension  Auditory Comprehension Overall Auditory Comprehension: Appears within functional limits for tasks assessed Visual Recognition/Discrimination Discrimination: Within Function Limits  Reading Comprehension Reading Status: Not tested    Expression Expression Primary Mode of Expression: Verbal Verbal Expression Overall Verbal Expression: Appears within functional limits for tasks assessed Written Expression Dominant Hand: Right   Oral / Motor  Oral  Motor/Sensory Function Overall Oral Motor/Sensory Function: Mild impairment Facial ROM: Reduced left;Suspected CN VII (facial) dysfunction Facial Symmetry: Abnormal symmetry left;Suspected CN VII (facial) dysfunction Facial Strength: Reduced left;Suspected CN VII (facial) dysfunction Motor Speech Overall Motor Speech: Impaired Respiration: Within functional limits Phonation: Normal Resonance: Within functional limits Articulation: Impaired Level of Impairment: Sentence Intelligibility: Intelligibility reduced Phrase: 75-100% accurate Sentence: 75-100% accurate Conversation: 75-100% accurate Motor Planning: Witnin functional limits   GO                    Blenda Mounts Laurice 05/29/2018, 10:52 AM Marchelle Folks L. Samson Frederic, [place] CCC/SLP Pager [phone]

## 2018-05-29 NOTE — Progress Notes (Signed)
  Echocardiogram 2D Echocardiogram has been performed.  Jonathan Daniels 05/29/2018, 11:17 AM

## 2018-05-30 DIAGNOSIS — I6300 Cerebral infarction due to thrombosis of unspecified precerebral artery: Secondary | ICD-10-CM

## 2018-05-30 LAB — BASIC METABOLIC PANEL
Anion gap: 8 (ref 5–15)
BUN: 19 mg/dL (ref 8–23)
CALCIUM: 9.1 mg/dL (ref 8.9–10.3)
CO2: 25 mmol/L (ref 22–32)
CREATININE: 1.24 mg/dL (ref 0.61–1.24)
Chloride: 107 mmol/L (ref 98–111)
GFR calc Af Amer: >60 mL/min (ref >60)
GFR, EST NON AFRICAN AMERICAN: 53 mL/min — AB (ref >60)
GLUCOSE: 154 mg/dL — AB (ref 70–99)
Potassium: 3.8 mmol/L (ref 3.5–5.1)
Sodium: 140 mmol/L (ref 135–145)

## 2018-05-30 LAB — CBC
HCT: 35.7 % — ABNORMAL LOW (ref 39.0–52.0)
Hemoglobin: 11.6 g/dL — ABNORMAL LOW (ref 13.0–17.0)
MCH: 28 pg (ref 26.0–34.0)
MCHC: 32.5 g/dL (ref 30.0–36.0)
MCV: 86.2 fL (ref 78.0–100.0)
PLATELETS: 190 10*3/uL (ref 150–400)
RBC: 4.14 MIL/uL — ABNORMAL LOW (ref 4.22–5.81)
RDW: 14.4 % (ref 11.5–15.5)
WBC: 7.6 10*3/uL (ref 4.0–10.5)

## 2018-05-30 LAB — GLUCOSE, CAPILLARY
GLUCOSE-CAPILLARY: 89 mg/dL (ref 70–99)
GLUCOSE-CAPILLARY: 109 mg/dL — AB (ref 70–99)
GLUCOSE-CAPILLARY: 133 mg/dL — AB (ref 70–99)
Glucose-Capillary: 130 mg/dL — ABNORMAL HIGH (ref 70–99)

## 2018-05-30 NOTE — Progress Notes (Signed)
Family Medicine Teaching Service Daily Progress Note Intern Pager: [phone]  Patient name: Jonathan Daniels. Medical record number: [id number] Date of birth: 1936-08-29 Age: 82 y.o. Gender: male  Primary Care Provider: [facility], [facility] Consultants: Neuro Code Status: Full  Pt Overview and Major Events to Date:  7/18 - admit for CVA  Assessment and Plan: Jonathan Danielsis a 82 y.o.malepresenting with facial droop and dysarthria. PMH is significant forhypertension, T2DM, GERD, and Hip arthroplasty.  Acute Ischemic Stroke: presented with facial droop, slurred speech -place in observation, attending Dr. Perley Jain -monitor on telemetry -neurology consulting, appreciate recommendations - 7/22 - TEE and Loop recorder implant scheduled for Monday - atorva 80 mg daily - high dose aspirin daily - permissive HTN, treat SBP if >220 - PT/OT eval  HTN:on problem list, no antihypertensive on home med list - monitor BP - permissive HTN - would treat SBP if > 220  - Recommend Indapamide/ACEi combo for HTN outpatient  HLD:on problem list, no statin. Last LDL in EMR 86 from July 2014. - repeat lipid panel - atorvastatin 80 mg daily  Glaucoma:chronic, stable - continue home eye drops  T2DM:on problem list. Last A1C 2014 elevated to 7.3 - A1C = 6.6% (7/18) - control sugars if elevated  Hx enlarged prostate:elevated PSA -Previously 6.13 > 6.66 > 8.75 (05/27/2013) - Patient denies urinary retention   FEN/GI:Regular Diet Prophylaxis:SCDs  Disposition:place in tele obvs  Subjective:  The patient is seen ambulating the hallway with a walker. He is moving quickly and balancing well and says he enjoys the exercise. He says he is feeling well and feels about the same as yesterday. He says the left-sided facial droop is unchanged he hopes he will be speaking better soon. He was talking about the Loop recorder scheduled for Monday.  He denies new focal neuro  deficits, weakness, lightheadedness, headaches, chest pain, shortness of breath, nausea, vomiting, diarrhea, and extremity weakness or pain.  Objective: Temp:  [97.8 F (36.6 Daniels)-98.5 F (36.9 Daniels)] 97.8 F (36.6 Daniels) (07/20 0357) Pulse Rate:  [51-62] 53 (07/20 0357) Resp:  [18-20] 18 (07/20 0357) BP: (108-161)/(64-97) 108/64 (07/20 0357) SpO2:  [95 %-100 %] 97 % (07/20 0357)  Physical Exam: Constitutional: He isoriented to person, place, and time. He appearswell-developedand well-nourished.  HENT:  Head:Normocephalicand atraumatic. Left mouth and cheek continue to droop. Eyes:Conjunctivaeand EOMare normal.  Neck:Normal range of motion.Neck supple.No tracheal deviationpresent. No thyromegalypresent.  Cardiovascular:Normal rate,regular rhythm,normal heart soundsand intact distal pulses. Exam revealsno gallopand no friction rub. No murmurheard. Pulmonary/Chest:Effort normaland breath sounds normal. Nostridor. Norespiratory distress.  Abdominal:Soft.Bowel sounds are normal. He exhibitsno distension. There isno tenderness. There isno reboundand no guarding.  Musculoskeletal:Normal range of motion. He exhibits noedemaor deformity.  Neurological: He isalertand oriented to person, place, and time. Acranial nerve deficit(7th CN decreased smile on L; otherwise CN 2-12 intact )is present. No sensory deficit. He exhibitsnormal muscle tone.Coordinationnormal.  Skin: Skin iswarmand dry. Capillary refill takesless than 2 seconds. Noerythema. Nopallor.  Psychiatric: He has anormal mood and affect. Hisbehavior is normal.Judgmentand thought contentnormal.  Laboratory: Recent Labs  Lab 05/28/18 0956 05/29/18 0353 05/30/18 0352  WBC 8.6 7.9 7.6  HGB 12.8* 11.8* 11.6*  HCT 39.8 35.8* 35.7*  PLT 197 185 190   Recent Labs  Lab 05/28/18 0956 05/29/18 0353 05/30/18 0352  NA 139 141 140  K 4.0 4.2 3.8  CL 107 108 107  CO2 23 26 25   BUN 12 12 19    CREATININE 1.27* 1.23 1.24  CALCIUM 9.6 9.2  9.1  PROT 6.6  --   --   BILITOT 1.0  --   --   ALKPHOS 80  --   --   ALT 20  --   --   AST 24  --   --   GLUCOSE 174* 122* 154*   Lipid Panel (7/18): Cholesterol 117, Trigl 124, HDL 24, LDL 68 HbA1c: 7.1 > 7.3 > 6.6 (7/18) PT Time (7/18): 13.8 nml INR (7/18): 1.07 nml aPTT (7/18): 32 nml TSH (7/19): 4.837 H  Imaging/Diagnostic Tests: Ct Angio Head W Or Wo Contrast: 05/29/2018 IMPRESSION:  1. Patent carotid and vertebral arteries. No dissection, aneurysm, or hemodynamically significant stenosis utilizing NASCET criteria.  2. Patent anterior and posterior intracranial circulation. No large vessel occlusion, aneurysm, or significant stenosis.  3. Non stenotic beaded irregularity of right mid cervical ICA may be due to atherosclerotic changes or fibromuscular dysplasia.   Ct Head Wo Contrast: 05/28/2018 IMPRESSION: Focal decreased attenuation in the right mid to superior frontal lobe, suspicious for early infarct in this area. No other findings which are felt to be indicative of acute infarct. There is age related volume loss with slight periventricular small vessel disease. No mass or hemorrhage evident. Mild calcification in the right carotid siphon noted. Extensive paranasal sinus disease noted, most notably in the right sphenoid sinus.   Ct Angio Neck W Or Wo Contrast: 05/29/2018 IMPRESSION:  1. Patent carotid and vertebral arteries. No dissection, aneurysm, or hemodynamically significant stenosis utilizing NASCET criteria.  2. Patent anterior and posterior intracranial circulation. No large vessel occlusion, aneurysm, or significant stenosis.  3. Non stenotic beaded irregularity of right mid cervical ICA may be due to atherosclerotic changes or fibromuscular dysplasia.  Mr Brain Wo Contrast: 05/28/2018 IMPRESSION: Nonhemorrhagic acute infarction affecting the RIGHT insula and RIGHT frontal lobe, primarily cortical in nature. Atrophy and  small vessel disease. No proximal large vessel occlusion is evident.   TEE: 06/01/2018   Dollene ClevelandAnderson, Jonathan Reagle C, DO 05/30/2018, 8:21 AM PGY-1, [facility] FPTS Intern pager: [phone], text pages welcome

## 2018-05-30 NOTE — Discharge Summary (Signed)
Family Medicine Teaching Service [facility] Discharge Summary  Patient name: Jonathan Daniels. Medical record number: [id number] Date of birth: [birth year] Age: 82 y.o. Gender: male Date of Admission: 05/28/2018  Date of Discharge: 06/01/2018 Admitting Physician: Leighton Roach McDiarmid, MD  Primary Care Provider: [facility] Consultants: Neuro  Indication for Hospitalization: Stroke  Discharge Diagnoses/Problem List:  Facial Droop 2/2 CVA Dysarthria 2/2 CVA Essential Hyperteinsion Pure Hypercholesterolemia T2DM Left Hip Arthroscopy  Disposition: Discharge home  Discharge Condition: Stable  Discharge Exam: Constitutional:No distress.  Cardiovascular:Normal rateand regular rhythm. Exam revealsno gallop.No murmurheard.  No rub. Pulmonary/Chest:Effort normaland breath sounds normal. Norespiratory distress. He hasno wheezes. He hasno rales.  Abdominal:Soft.Bowel sounds are normal. He exhibitsno distension. There isno tenderness.  Musculoskeletal: 5/5 strength BUE, BLE Neurological: Stable left sided facial droop, slight slurred speech, otherwise neurologically intact Skin: He isnot diaphoretic.   Brief Hospital Course:  Loye Vento is an 82 year old male admitted for facial droop and dysarthria secondary to a CVA. Neurology was consulted for management. An MRI showed a nonhemorrhagic acute infarction affecting the right insula and right front lobe that was primarily cortical in nature. An echo was performed and revealed no source of emboli.  The CTA of his head and neck were unremarkable.  It was then recommended to pursue a cardioembolic workup.  A TEE which noted an EF 60-65%, mild MR, an intra-atrial septal aneurysm, and PFO. A loop recorder was placed.  The patient is to follow up at the stroke clinic in 6 weeks.  Throughout the patient's hospitalization, he remained hemodynamically stable.  His neurologic exam remained stable throughout his hospitalization  with slightly slurred speech and left sided facial droop.     Issues for Follow Up:  1. Continue Atorvastatin for control of Hyperlipidemia. 2. Continue to monitor blood sugar at home and maintain acceptable blood glucose levels. 3. Follow up with stroke clinic in 6 weeks as outpatient. 4. Patient will continue to take both aspirin 81mg  and Plavix 75mg  for 3 months, then Plavix alone.    Significant Procedures:  7/23 - Loop Recorder 7/23 - TEE  Significant Labs and Imaging:  Recent Labs  Lab 05/29/18 0353 05/30/18 0352 05/31/18 0302  WBC 7.9 7.6 9.1  HGB 11.8* 11.6* 11.7*  HCT 35.8* 35.7* 36.1*  PLT 185 190 179   Recent Labs  Lab 05/28/18 0956 05/29/18 0353 05/30/18 0352 05/31/18 0302  NA 139 141 140 141  K 4.0 4.2 3.8 3.7  CL 107 108 107 109  CO2 23 26 25 26   GLUCOSE 174* 122* 154* 123*  BUN 12 12 19 21   CREATININE 1.27* 1.23 1.24 1.23  CALCIUM 9.6 9.2 9.1 9.2  ALKPHOS 80  --   --   --   AST 24  --   --   --   ALT 20  --   --   --   ALBUMIN 3.7  --   --   --     Lipid Panel (7/18):Cholesterol 117, Trigl 124, HDL 24, LDL 68 HbA1c:7.1 >7.3 >6.6 (7/18) PT Time (7/18): 13.8 nml INR (7/18): 1.07 nml aPTT (7/18): 32 nml TSH (7/19):4.837 H  Ct Angio Head W Or Wo Contrast  Result Date: 05/29/2018 CLINICAL DATA:  82 y/o  M; stroke for follow-up. EXAM: CT ANGIOGRAPHY HEAD AND NECK TECHNIQUE: Multidetector CT imaging of the head and neck was performed using the standard protocol during bolus administration of intravenous contrast. Multiplanar CT image reconstructions and MIPs were obtained to evaluate the vascular  anatomy. Carotid stenosis measurements (when applicable) are obtained utilizing NASCET criteria, using the distal internal carotid diameter as the denominator. CONTRAST:  50 cc Isovue 370 COMPARISON:  05/28/2018 MRI head. FINDINGS: CTA NECK FINDINGS Aortic arch: Bovine variant branching. Imaged portion shows no evidence of aneurysm or dissection. No  significant stenosis of the major arch vessel origins. Mild calcific atherosclerosis. Right carotid system: No evidence of dissection, stenosis (50% or greater) or occlusion. Non stenotic beaded irregularity of the right mid ICA. Left carotid system: No evidence of dissection, stenosis (50% or greater) or occlusion. Vertebral arteries: Right dominant. No evidence of dissection, stenosis (50% or greater) or occlusion. Skeleton: Mild cervical spondylosis with multilevel disc and facet degenerative changes greatest at the C4-C6 levels. No high-grade bony canal stenosis. Other neck: Negative. Upper chest: Negative. Review of the MIP images confirms the above findings CTA HEAD FINDINGS Anterior circulation: No significant stenosis, proximal occlusion, aneurysm, or vascular malformation. Posterior circulation: No significant stenosis, proximal occlusion, aneurysm, or vascular malformation. Venous sinuses: As permitted by contrast timing, patent. Anatomic variants: Complete circle-of-Willis. Delayed phase: No abnormal intracranial enhancement. Review of the MIP images confirms the above findings IMPRESSION: 1. Patent carotid and vertebral arteries. No dissection, aneurysm, or hemodynamically significant stenosis utilizing NASCET criteria. 2. Patent anterior and posterior intracranial circulation. No large vessel occlusion, aneurysm, or significant stenosis. 3. Non stenotic beaded irregularity of right mid cervical ICA may be due to atherosclerotic changes or fibromuscular dysplasia. Electronically Signed   By: Mitzi Hansen M.D.   On: 05/29/2018 05:52   Ct Head Wo Contrast  Result Date: 05/28/2018 CLINICAL DATA:  Slurred speech with left-sided facial droop EXAM: CT HEAD WITHOUT CONTRAST TECHNIQUE: Contiguous axial images were obtained from the base of the skull through the vertex without intravenous contrast. COMPARISON:  None. FINDINGS: Brain: There is age related volume loss. There is no mass, hemorrhage,  extra-axial fluid collection, or midline shift. There is focal decreased attenuation in the posterior mid to superior right frontal lobe, an appearance suspicious for acute infarct. This appearance is best seen on axial slice 20 series 3, coronal slice 35 series 5, and sagittal slice 16 series 6. Elsewhere, there is mild small vessel disease in the centra semiovale bilaterally. Small foci of basal ganglia calcification are likely physiologic. Vascular: There is no appreciable hyperdense vessel. There is mild calcification in the right carotid siphon region. Skull: Bony calvarium appears intact. Sinuses/Orbits: There is extensive opacification in the right sphenoid sinus region. There is mucosal thickening and opacification in several ethmoid air cells bilaterally. Visualized orbits appear symmetric bilaterally. Other: Visualized mastoid air cells are clear. IMPRESSION: Focal decreased attenuation in the right mid to superior frontal lobe, suspicious for early infarct in this area. No other findings which are felt to be indicative of acute infarct. There is age related volume loss with slight periventricular small vessel disease. No mass or hemorrhage evident. Mild calcification in the right carotid siphon noted. Extensive paranasal sinus disease noted, most notably in the right sphenoid sinus. These results were called by telephone at the time of interpretation on 05/28/2018 at 11:57 am to Dr. Gerhard Munch , who verbally acknowledged these results. Electronically Signed   By: Bretta Bang III M.D.   On: 05/28/2018 11:57   Ct Angio Neck W Or Wo Contrast  Result Date: 05/29/2018 CLINICAL DATA:  82 y/o  M; stroke for follow-up. EXAM: CT ANGIOGRAPHY HEAD AND NECK TECHNIQUE: Multidetector CT imaging of the head and neck was performed using the  standard protocol during bolus administration of intravenous contrast. Multiplanar CT image reconstructions and MIPs were obtained to evaluate the vascular anatomy.  Carotid stenosis measurements (when applicable) are obtained utilizing NASCET criteria, using the distal internal carotid diameter as the denominator. CONTRAST:  50 cc Isovue 370 COMPARISON:  05/28/2018 MRI head. FINDINGS: CTA NECK FINDINGS Aortic arch: Bovine variant branching. Imaged portion shows no evidence of aneurysm or dissection. No significant stenosis of the major arch vessel origins. Mild calcific atherosclerosis. Right carotid system: No evidence of dissection, stenosis (50% or greater) or occlusion. Non stenotic beaded irregularity of the right mid ICA. Left carotid system: No evidence of dissection, stenosis (50% or greater) or occlusion. Vertebral arteries: Right dominant. No evidence of dissection, stenosis (50% or greater) or occlusion. Skeleton: Mild cervical spondylosis with multilevel disc and facet degenerative changes greatest at the C4-C6 levels. No high-grade bony canal stenosis. Other neck: Negative. Upper chest: Negative. Review of the MIP images confirms the above findings CTA HEAD FINDINGS Anterior circulation: No significant stenosis, proximal occlusion, aneurysm, or vascular malformation. Posterior circulation: No significant stenosis, proximal occlusion, aneurysm, or vascular malformation. Venous sinuses: As permitted by contrast timing, patent. Anatomic variants: Complete circle-of-Willis. Delayed phase: No abnormal intracranial enhancement. Review of the MIP images confirms the above findings IMPRESSION: 1. Patent carotid and vertebral arteries. No dissection, aneurysm, or hemodynamically significant stenosis utilizing NASCET criteria. 2. Patent anterior and posterior intracranial circulation. No large vessel occlusion, aneurysm, or significant stenosis. 3. Non stenotic beaded irregularity of right mid cervical ICA may be due to atherosclerotic changes or fibromuscular dysplasia. Electronically Signed   By: Mitzi Hansen M.D.   On: 05/29/2018 05:52   Mr Brain Wo  Contrast  Result Date: 05/28/2018 CLINICAL DATA:  Dysarthria, and LEFT facial droop upon awakening. EXAM: MRI HEAD WITHOUT CONTRAST TECHNIQUE: Multiplanar, multiecho pulse sequences of the brain and surrounding structures were obtained without intravenous contrast. COMPARISON:  CT head earlier today. FINDINGS: The patient would not allow completion of the scan. Axial T1 weighted images were omitted. Overall the study is diagnostic. Brain: Multifocal areas of restricted diffusion, RIGHT MCA distribution, affect the RIGHT insula, frontal operculum, as well as the prefrontal gyrus over the convexity, corresponding low ADC, consistent with acute infarction. These are primarily cortical in nature, although some subcortical white matter may be affected. No similar findings on the LEFT or in the posterior circulation. No hemorrhage, mass lesion, hydrocephalus, or extra-axial fluid. Cerebral and cerebellar atrophy. Mild subcortical and periventricular T2 and FLAIR hyperintensities, likely chronic microvascular ischemic change. Small BILATERAL chronic thalamic infarcts. Vascular: Flow voids are maintained. There are no areas of chronic hemorrhage. Skull and upper cervical spine: Normal marrow signal. Sinuses/Orbits: Negative. Other: Compared with recent CT, there is good general agreement. IMPRESSION: Nonhemorrhagic acute infarction affecting the RIGHT insula and RIGHT frontal lobe, primarily cortical in nature. Atrophy and small vessel disease. No proximal large vessel occlusion is evident. Electronically Signed   By: Elsie Stain M.D.   On: 05/28/2018 15:26   Results/Tests Pending at Time of Discharge: none  Discharge Medications:  Allergies as of 06/01/2018   No Known Allergies     Medication List    STOP taking these medications   aspirin 81 MG tablet Replaced by:  aspirin 81 MG EC tablet   baclofen 10 MG tablet Commonly known as:  LIORESAL   CINNAMON PO   GLUCOSAMINE CHONDR COMPLEX PO    HYDROcodone-acetaminophen 10-325 MG tablet Commonly known as:  NORCO   ibuprofen 200 MG  tablet Commonly known as:  ADVIL,MOTRIN     TAKE these medications   aspirin 81 MG EC tablet Take 1 tablet (81 mg total) by mouth daily. Replaces:  aspirin 81 MG tablet   atorvastatin 10 MG tablet Commonly known as:  LIPITOR Take 1 tablet (10 mg total) by mouth daily at 6 PM.   bimatoprost 0.03 % ophthalmic solution Commonly known as:  LUMIGAN Place 1 drop into both eyes at bedtime.   clopidogrel 75 MG tablet Commonly known as:  PLAVIX Take 1 tablet (75 mg total) by mouth daily.   COMBIGAN 0.2-0.5 % ophthalmic solution Generic drug:  brimonidine-timolol Place 1 drop into both eyes every 12 (twelve) hours.   multivitamin with minerals Tabs tablet Take 1 tablet by mouth daily.   senna-docusate 8.6-50 MG tablet Commonly known as:  Senokot-S Take 1 tablet by mouth at bedtime as needed for moderate constipation. What changed:    how much to take  when to take this  reasons to take this       Discharge Instructions: Please refer to Patient Instructions section of EMR for full details.  Patient was counseled important signs and symptoms that should prompt return to medical care, changes in medications, dietary instructions, activity restrictions, and follow up appointments.   Follow-Up Appointments: Follow-up Information    [facility] Follow [facility].   Specialty:  Rehabilitation Why:  for outpatient PT. They will call you in the next 3-5 business days to schedule your first appointment.  Contact information: [address] [id number] [place]c [place] [id number] [phone]       [facility] [address] [facility] Follow up on 06/15/2018.   Specialty:  Cardiology Why:  10:00AM, wound check visit Contact information: [address], [address] [place] [id number] [phone]       Micki RileySethi, Pramod S,  MD. Schedule an appointment as soon as possible for a visit in 6 week(s).   Specialties:  Neurology, Radiology Contact information: [address] [place] [id number] [phone]        [facility] Schedule an appointment as soon as possible for a visit in 1 week(s).   Why:  For hospital follow up Contact information: [address] [facility] [address] [place] [id number] [phone]           Stephannie LiRittberger, Bailey J, DO 06/03/2018, 7:50 AM PGY-1, [facility]

## 2018-05-30 NOTE — Progress Notes (Signed)
STROKE TEAM PROGRESS NOTE   SUBJECTIVE (INTERVAL HISTORY) His wife is at the bedside.  He states his improving but still has left facial droop and mild slurred speech.  Left hand numbness resolved already.  MRI showed right insular cortex and right frontal infarct, embolic pattern.  Agree with TEE and loop recorder on Monday.  OBJECTIVE Temp:  [97.5 F (36.4 C)-98.5 F (36.9 C)] 98.4 F (36.9 C) (07/20 1202) Pulse Rate:  [51-66] 56 (07/20 1202) Cardiac Rhythm: Sinus bradycardia;Normal sinus rhythm (07/20 0700) Resp:  [16-20] 16 (07/20 1202) BP: (108-161)/(64-97) 130/66 (07/20 1202) SpO2:  [95 %-100 %] 98 % (07/20 1202)  CBC:  Recent Labs  Lab 05/28/18 0956 05/29/18 0353 05/30/18 0352  WBC 8.6 7.9 7.6  NEUTROABS 5.8  --   --   HGB 12.8* 11.8* 11.6*  HCT 39.8 35.8* 35.7*  MCV 87.1 85.6 86.2  PLT 197 185 190    Basic Metabolic Panel:  Recent Labs  Lab 05/29/18 0353 05/30/18 0352  NA 141 140  K 4.2 3.8  CL 108 107  CO2 26 25  GLUCOSE 122* 154*  BUN 12 19  CREATININE 1.23 1.24  CALCIUM 9.2 9.1    Lipid Panel:     Component Value Date/Time   CHOL 117 05/29/2018 0353   TRIG 124 05/29/2018 0353   HDL 24 (L) 05/29/2018 0353   CHOLHDL 4.9 05/29/2018 0353   VLDL 25 05/29/2018 0353   LDLCALC 68 05/29/2018 0353   HgbA1c:  Lab Results  Component Value Date   HGBA1C 6.6 (H) 05/29/2018   Urine Drug Screen: No results found for: LABOPIA, COCAINSCRNUR, LABBENZ, AMPHETMU, THCU, LABBARB  Alcohol Level No results found for: ETH  IMAGING I have personally reviewed the radiological images below and agree with the radiology interpretations.  Ct Angio Head W Or Wo Contrast Ct Angio Neck W Or Wo Contrast 05/29/2018 IMPRESSION:  1. Patent carotid and vertebral arteries. No dissection, aneurysm, or hemodynamically significant stenosis utilizing NASCET criteria.  2. Patent anterior and posterior intracranial circulation. No large vessel occlusion, aneurysm, or significant  stenosis.  3. Non stenotic beaded irregularity of right mid cervical ICA may be due to atherosclerotic changes or fibromuscular dysplasia.   Ct Head Wo Contrast 05/28/2018 IMPRESSION:  Focal decreased attenuation in the right mid to superior frontal lobe, suspicious for early infarct in this area. No other findings which are felt to be indicative of acute infarct. There is age related volume loss with slight periventricular small vessel disease. No mass or hemorrhage evident. Mild calcification in the right carotid siphon noted. Extensive paranasal sinus disease noted, most notably in the right sphenoid sinus.   Mr Brain Wo Contrast 05/28/2018 IMPRESSION: Nonhemorrhagic acute infarction affecting the RIGHT insula and RIGHT frontal lobe, primarily cortical in nature. Atrophy and small vessel disease. No proximal large vessel occlusion is evident.   Transthoracic Echocardiogram 05/20/2018 Study Conclusions - Left ventricle: The cavity size was normal. There was moderate   concentric hypertrophy. Systolic function was vigorous. The   estimated ejection fraction was in the range of 65% to 70%. Wall   motion was normal; there were no regional wall motion   abnormalities. Doppler parameters are consistent with abnormal   left ventricular relaxation (grade 1 diastolic dysfunction).   There was no evidence of elevated ventricular filling pressure by   Doppler parameters. - Aortic valve: Valve area (VTI): 2.31 cm^2. Valve area (Vmax):   2.54 cm^2. Valve area (Vmean): 2.28 cm^2. - Aortic root: The aortic  root was normal in size. - Mitral valve: There was mild regurgitation. - Right ventricle: Systolic function was normal. - Tricuspid valve: There was mild regurgitation. - Pulmonary arteries: Systolic pressure was within the normal   range. - Inferior vena cava: The vessel was normal in size. - Pericardium, extracardiac: There was no pericardial effusion. Impressions: - No cardiac source of  emboli was indentified.  TEE pending  PHYSICAL EXAM  Temp:  [97.5 F (36.4 C)-98.5 F (36.9 C)] 98.4 F (36.9 C) (07/20 1202) Pulse Rate:  [51-66] 56 (07/20 1202) Resp:  [16-20] 16 (07/20 1202) BP: (108-161)/(64-97) 130/66 (07/20 1202) SpO2:  [95 %-100 %] 98 % (07/20 1202)  General - Well nourished, well developed, in no apparent distress.  Ophthalmologic - fundi not visualized due to noncooperation.  Cardiovascular - Regular rate and rhythm with no murmur.  Mental Status -  Level of arousal and orientation to time, place, and person were intact. Language including expression, naming, repetition, comprehension was assessed and found intact.  Mild dysarthria Fund of Knowledge was assessed and was intact.  Cranial Nerves II - XII - II - Visual field intact OU. III, IV, VI - Extraocular movements intact. V - Facial sensation intact bilaterally. VII - left facial droop. VIII - Hearing & vestibular intact bilaterally. X - Palate elevates symmetrically, mild dysarthria. XI - Chin turning & shoulder shrug intact bilaterally. XII - Tongue protrusion intact.  Motor Strength - The patient's strength was normal in all extremities and pronator drift was absent.  Bulk was normal and fasciculations were absent.   Motor Tone - Muscle tone was assessed at the neck and appendages and was normal.  Reflexes - The patient's reflexes were symmetrical in all extremities and he had no pathological reflexes.  Sensory - Light touch, temperature/pinprick were assessed and were symmetrical.    Coordination - The patient had normal movements in the hands and feet with no ataxia or dysmetria.  Tremor was absent.  Gait and Station - deferred.  ASSESSMENT/PLAN [name]. is a 82 y.o. male with history of diabetes mellitus, hypertension, hyperlipidemia, and arthritis presenting with left facial droop, dysarthria, and left hand numbness. He did not receive IV t-PA due to late  presentation.  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  Resultant left facial droop and mild dysarthria  CT head - Focal decreased attenuation in the right mid to superior frontal lobe, suspicious for early infarct in this area.  MRI head - Nonhemorrhagic acute infarction affecting the RIGHT insula and RIGHT frontal lobe, primarily cortical in nature.  CTA H&N - unremarkable.   2D Echo - EF 65 to 70%  Pt would like to stay for TEE and loop recorder on Monday for cardioembolic work-up-discussed with Dr. Sharrell Ku cardiologist to arrange  LDL - 68  HgbA1c - 6.6  VTE prophylaxis - SCDs  aspirin 81 mg daily prior to admission, now on aspirin 81 and Plavix 75 DAPT for 3 months and then Plavix alone.  Patient counseled to be compliant with his antithrombotic medications  Ongoing aggressive stroke risk factor management  Therapy recommendations:  pending  Disposition:  Pending  Hypertension  Stable .  Permissive hypertension (OK if < 220/120) but gradually normalize in 5-7 days .  Long-term BP goal normotensive  Hyperlipidemia  Lipid lowering medication PTA:  none  LDL 68, goal < 70  Current lipid lowering medication: Lipitor 10 mg daily  Continue  statin at discharge  Diabetes  HgbA1c 6.6, goal < 7.0  Controlled  SSI  CBG  Other Stroke Risk Factors  Advanced age  ETOH use, advised to drink no more than 1 alcoholic beverage per day.  Other Active Problems    Hospital day # 1  Delia HeadyPramod Sethi, MD Stroke Neurology 05/30/2018 12:43 PM  To contact Stroke Continuity provider, please refer to [email or web address]. After hours, contact General Neurology

## 2018-05-31 LAB — GLUCOSE, CAPILLARY
GLUCOSE-CAPILLARY: 103 mg/dL — AB (ref 70–99)
Glucose-Capillary: 126 mg/dL — ABNORMAL HIGH (ref 70–99)
Glucose-Capillary: 86 mg/dL (ref 70–99)
Glucose-Capillary: 145 mg/dL — ABNORMAL HIGH (ref 70–99)

## 2018-05-31 LAB — CBC
HEMATOCRIT: 36.1 % — AB (ref 39.0–52.0)
Hemoglobin: 11.7 g/dL — ABNORMAL LOW (ref 13.0–17.0)
MCH: 27.7 pg (ref 26.0–34.0)
MCHC: 32.4 g/dL (ref 30.0–36.0)
MCV: 85.5 fL (ref 78.0–100.0)
Platelets: 179 10*3/uL (ref 150–400)
RBC: 4.22 MIL/uL (ref 4.22–5.81)
RDW: 14.3 % (ref 11.5–15.5)
WBC: 9.1 10*3/uL (ref 4.0–10.5)

## 2018-05-31 LAB — BASIC METABOLIC PANEL
Anion gap: 6 (ref 5–15)
BUN: 21 mg/dL (ref 8–23)
CO2: 26 mmol/L (ref 22–32)
Calcium: 9.2 mg/dL (ref 8.9–10.3)
Chloride: 109 mmol/L (ref 98–111)
Creatinine, Ser: 1.23 mg/dL (ref 0.61–1.24)
GFR calc non Af Amer: 54 mL/min — ABNORMAL LOW (ref >60)
Glucose, Bld: 123 mg/dL — ABNORMAL HIGH (ref 70–99)
Potassium: 3.7 mmol/L (ref 3.5–5.1)
Sodium: 141 mmol/L (ref 135–145)

## 2018-05-31 NOTE — Progress Notes (Addendum)
Family Medicine Teaching Service Daily Progress Note Intern Pager: [phone]  Patient name: Jonathan FergusonJames Lunz Jr. Medical record number: [id number] Date of birth: 02/16/1936 Age: 82 y.o. Gender: male  [facility] Consultants: Neuro Code Status: Full  Pt Overview and Major Events to Date:  7/18 - admit for CVA  Assessment and Plan: Jonathan Danielsis a 82 y.o.malepresenting with facial droop and dysarthria. PMH is significant forhypertension, T2DM, GERD, and Hip arthroplasty.  Acute ischemic infarct of right insula and right frontal lobe: Stable Source of dysarthria and left-sided facial droop.  Likely related to hypertension and atherosclerotic disease.  Focal deficits persist with no new neurological findings.  PT/OT/SLP without recommendations for follow-up. TEE this AM, EF 60-65%, Mild MR, PFO. - Neurology consulted, appreciate recommendations -Planning loop recorder implant - Continue Lipitor 80 mg daily, and daily aspirin  IHK:VQQVZDHTN:Stable On problem list, no antihypertensive on home med list.  113/58 this AM. - monitor BP - permissive HTN - would treat SBP if > 220  - Recommend Indapamide/ACEi combo for HTN outpatient  GLO:VFIEPPIHLD:Chronic, Stable On problem list, no statin. LDL on 7/19 68.  HDL 24. - repeat lipid panel - cont atorvastatin 10 mg daily, per neuro rec  Glaucoma:Chronic, stable - continue home eye drops  T2DM: Well-Controlled On problem list. A1C on 7/18 = 6.6%  - control sugars if elevated  Hx enlarged prostate:Chronic Elevated PSA. Previously 6.13 > 6.66 > 8.75 (05/27/2013) - Patient denies urinary retention   FEN/GI:Regular Diet Prophylaxis:SCDs  Disposition:likely back to SNF today pending loop recorder insertion  Subjective:  **Unable to exam as patient getting TEE and Loop recorder**  Objective: Temp:  [97.7 F (36.5 C)-98.4 F (36.9 C)] 98.3 F (36.8 C) (07/22 0439) Pulse Rate:  [55-63] 58 (07/22  0439) Resp:  [16-20] 20 (07/22 0439) BP: (113-142)/(58-78) 113/58 (07/22 0439) SpO2:  [96 %-100 %] 96 % (07/22 0439)   ** Unable to exam as patient getting TEE and Loop Recorder**  Laboratory: Recent Labs  Lab 05/29/18 0353 05/30/18 0352 05/31/18 0302  WBC 7.9 7.6 9.1  HGB 11.8* 11.6* 11.7*  HCT 35.8* 35.7* 36.1*  PLT 185 190 179   Recent Labs  Lab 05/28/18 0956 05/29/18 0353 05/30/18 0352 05/31/18 0302  NA 139 141 140 141  K 4.0 4.2 3.8 3.7  CL 107 108 107 109  CO2 23 26 25 26   BUN 12 12 19 21   CREATININE 1.27* 1.23 1.24 1.23  CALCIUM 9.6 9.2 9.1 9.2  PROT 6.6  --   --   --   BILITOT 1.0  --   --   --   ALKPHOS 80  --   --   --   ALT 20  --   --   --   AST 24  --   --   --   GLUCOSE 174* 122* 154* 123*   Lipid Panel (7/18): Cholesterol 117, Trigl 124, HDL 24, LDL 68 HbA1c: 7.1 > 7.3 > 6.6 (7/18) PT Time (7/18): 13.8 nml INR (7/18): 1.07 nml aPTT (7/18): 32 nml TSH (7/19): 4.837 H  Imaging/Diagnostic Tests: Ct Angio Head W Or Wo Contrast: 05/29/2018 IMPRESSION:  1. Patent carotid and vertebral arteries. No dissection, aneurysm, or hemodynamically significant stenosis utilizing NASCET criteria.  2. Patent anterior and posterior intracranial circulation. No large vessel occlusion, aneurysm, or significant stenosis.  3. Non stenotic beaded irregularity of right mid cervical ICA may be due to atherosclerotic changes or fibromuscular dysplasia.   Ct Head  Wo Contrast: 05/28/2018 IMPRESSION: Focal decreased attenuation in the right mid to superior frontal lobe, suspicious for early infarct in this area. No other findings which are felt to be indicative of acute infarct. There is age related volume loss with slight periventricular small vessel disease. No mass or hemorrhage evident. Mild calcification in the right carotid siphon noted. Extensive paranasal sinus disease noted, most notably in the right sphenoid sinus.   Ct Angio Neck W Or Wo Contrast:  05/29/2018 IMPRESSION:  1. Patent carotid and vertebral arteries. No dissection, aneurysm, or hemodynamically significant stenosis utilizing NASCET criteria.  2. Patent anterior and posterior intracranial circulation. No large vessel occlusion, aneurysm, or significant stenosis.  3. Non stenotic beaded irregularity of right mid cervical ICA may be due to atherosclerotic changes or fibromuscular dysplasia.  Mr Brain Wo Contrast: 05/28/2018 IMPRESSION: Nonhemorrhagic acute infarction affecting the RIGHT insula and RIGHT frontal lobe, primarily cortical in nature. Atrophy and small vessel disease. No proximal large vessel occlusion is evident.   TEE: 06/01/2018   Rittberger, Solmon Ice, DO 06/01/2018, 6:34 AM PGY-1,  Family Medicine FPTS Intern pager: [phone], text pages welcome

## 2018-05-31 NOTE — Progress Notes (Signed)
Family Medicine Teaching Service Daily Progress Note Intern Pager: [phone]  Patient name: Jonathan Daniels. Medical record number: [id number] Date of birth: 05-25-1936 Age: 82 y.o. Gender: male  [facility] Care Provider: [facility] Consultants: Neuro Code Status: Full  Pt Overview and Major Events to Date:  7/18 - admit for CVA  Assessment and Plan: Eliav Mechling Jr.is a 82 y.o.malepresenting with facial droop and dysarthria. PMH is significant forhypertension, T2DM, GERD, and Hip arthroplasty.  Acute ischemic infarct of right insula and right frontal lobe: Source of dysarthria and left-sided facial droop.  Likely related to hypertension and atherosclerotic disease.  Focal deficits persist with no new neurological findings.  PT/OT/SLP without recommendations for follow-up. - Neurology consulted, appreciate recommendations - TEE and loop recorder implant scheduled 7/22 - Lipitor 80 mg daily, and daily aspirin  HTN:on problem list, no antihypertensive on home med list - monitor BP - permissive HTN - would treat SBP if > 220  - Recommend Indapamide/ACEi combo for HTN outpatient  HLD:on problem list, no statin. Last LDL in EMR 86 from July 2014. - repeat lipid panel - atorvastatin 80 mg daily  Glaucoma:chronic, stable - continue home eye drops  T2DM:on problem list. Last A1C 2014 elevated to 7.3 - A1C = 6.6% (7/18) - control sugars if elevated  Hx enlarged prostate:elevated PSA -Previously 6.13 > 6.66 > 8.75 (05/27/2013) - Patient denies urinary retention   FEN/GI:Regular Diet Prophylaxis:SCDs  Disposition:place in tele obvs, plan for TEE and LOOP recorder 7/22  Subjective:  Patient comfortable today.  No complaints this morning.  Denies chest pain or shortness of breath.  No new focal weakness.  Objective: Temp:  [97.3 F (36.3 C)-98.8 F (37.1 C)] 97.3 F (36.3 C) (07/21 0505) Pulse Rate:  [56-66] 57 (07/21 0505) Resp:  [16] 16 (07/21  0505) BP: (121-153)/(66-79) 121/73 (07/21 0505) SpO2:  [96 %-98 %] 97 % (07/21 0505)  Physical Exam: General: elderly male lying in bed on tablet, well nourished, well developed, NAD with non-toxic appearance HEENT: normocephalic, atraumatic, moist mucous membranes, PERRLA, EOMI Cardiovascular: regular rate and rhythm without murmurs, rubs, or gallops Lungs: clear to auscultation bilaterally with normal work of breathing Skin: warm, dry, no rashes or lesions, cap refill < 2 seconds Extremities: warm and well perfused, normal tone, no edema Psych: euthymic mood, congruent affect Neuro: CNII-XII intact with exception to left-sided facial droop, speech slightly mumbled though coherent, fine motor intact  Laboratory: Recent Labs  Lab 05/29/18 0353 05/30/18 0352 05/31/18 0302  WBC 7.9 7.6 9.1  HGB 11.8* 11.6* 11.7*  HCT 35.8* 35.7* 36.1*  PLT 185 190 179   Recent Labs  Lab 05/28/18 0956 05/29/18 0353 05/30/18 0352 05/31/18 0302  NA 139 141 140 141  K 4.0 4.2 3.8 3.7  CL 107 108 107 109  CO2 23 26 25 26   BUN 12 12 19 21   CREATININE 1.27* 1.23 1.24 1.23  CALCIUM 9.6 9.2 9.1 9.2  PROT 6.6  --   --   --   BILITOT 1.0  --   --   --   ALKPHOS 80  --   --   --   ALT 20  --   --   --   AST 24  --   --   --   GLUCOSE 174* 122* 154* 123*   Lipid Panel (7/18): Cholesterol 117, Trigl 124, HDL 24, LDL 68 HbA1c: 7.1 > 7.3 > 6.6 (7/18) PT Time (7/18): 13.8 nml INR (7/18): 1.07 nml aPTT (  7/18): 32 nml TSH (7/19): 4.837 H  Imaging/Diagnostic Tests: Ct Angio Head W Or Wo Contrast: 05/29/2018 IMPRESSION:  1. Patent carotid and vertebral arteries. No dissection, aneurysm, or hemodynamically significant stenosis utilizing NASCET criteria.  2. Patent anterior and posterior intracranial circulation. No large vessel occlusion, aneurysm, or significant stenosis.  3. Non stenotic beaded irregularity of right mid cervical ICA may be due to atherosclerotic changes or fibromuscular dysplasia.    Ct Head Wo Contrast: 05/28/2018 IMPRESSION: Focal decreased attenuation in the right mid to superior frontal lobe, suspicious for early infarct in this area. No other findings which are felt to be indicative of acute infarct. There is age related volume loss with slight periventricular small vessel disease. No mass or hemorrhage evident. Mild calcification in the right carotid siphon noted. Extensive paranasal sinus disease noted, most notably in the right sphenoid sinus.   Ct Angio Neck W Or Wo Contrast: 05/29/2018 IMPRESSION:  1. Patent carotid and vertebral arteries. No dissection, aneurysm, or hemodynamically significant stenosis utilizing NASCET criteria.  2. Patent anterior and posterior intracranial circulation. No large vessel occlusion, aneurysm, or significant stenosis.  3. Non stenotic beaded irregularity of right mid cervical ICA may be due to atherosclerotic changes or fibromuscular dysplasia.  Mr Brain Wo Contrast: 05/28/2018 IMPRESSION: Nonhemorrhagic acute infarction affecting the RIGHT insula and RIGHT frontal lobe, primarily cortical in nature. Atrophy and small vessel disease. No proximal large vessel occlusion is evident.   TEE: 06/01/2018   [name], David J, DO 05/31/2018, 7:26 AM PGY-3, [place] [facility] FPTS Intern pager: [phone], text pages welcome

## 2018-05-31 NOTE — Progress Notes (Signed)
STROKE TEAM PROGRESS NOTE   SUBJECTIVE (INTERVAL HISTORY) No one is at the bedside.  He states he  is improving but still has left facial droop and mild slurred speech.  Left hand numbness resolved   Agree with TEE and loop recorder on Monday.  OBJECTIVE Temp:  [97.3 F (36.3 C)-98.8 F (37.1 C)] 97.8 F (36.6 C) (07/21 1123) Pulse Rate:  [57-63] 61 (07/21 1123) Cardiac Rhythm: Sinus bradycardia (07/21 0700) Resp:  [16-20] 20 (07/21 1123) BP: (118-146)/(66-79) 118/68 (07/21 1123) SpO2:  [96 %-100 %] 100 % (07/21 1123)  CBC:  Recent Labs  Lab 05/28/18 0956  05/30/18 0352 05/31/18 0302  WBC 8.6   < > 7.6 9.1  NEUTROABS 5.8  --   --   --   HGB 12.8*   < > 11.6* 11.7*  HCT 39.8   < > 35.7* 36.1*  MCV 87.1   < > 86.2 85.5  PLT 197   < > 190 179   < > = values in this interval not displayed.    Basic Metabolic Panel:  Recent Labs  Lab 05/30/18 0352 05/31/18 0302  NA 140 141  K 3.8 3.7  CL 107 109  CO2 25 26  GLUCOSE 154* 123*  BUN 19 21  CREATININE 1.24 1.23  CALCIUM 9.1 9.2    Lipid Panel:     Component Value Date/Time   CHOL 117 05/29/2018 0353   TRIG 124 05/29/2018 0353   HDL 24 (L) 05/29/2018 0353   CHOLHDL 4.9 05/29/2018 0353   VLDL 25 05/29/2018 0353   LDLCALC 68 05/29/2018 0353   HgbA1c:  Lab Results  Component Value Date   HGBA1C 6.6 (H) 05/29/2018   Urine Drug Screen: No results found for: LABOPIA, COCAINSCRNUR, LABBENZ, AMPHETMU, THCU, LABBARB  Alcohol Level No results found for: ETH  IMAGING I have personally reviewed the radiological images below and agree with the radiology interpretations.  Ct Angio Head W Or Wo Contrast Ct Angio Neck W Or Wo Contrast 05/29/2018 IMPRESSION:  1. Patent carotid and vertebral arteries. No dissection, aneurysm, or hemodynamically significant stenosis utilizing NASCET criteria.  2. Patent anterior and posterior intracranial circulation. No large vessel occlusion, aneurysm, or significant stenosis.  3. Non  stenotic beaded irregularity of right mid cervical ICA may be due to atherosclerotic changes or fibromuscular dysplasia.   Ct Head Wo Contrast 05/28/2018 IMPRESSION:  Focal decreased attenuation in the right mid to superior frontal lobe, suspicious for early infarct in this area. No other findings which are felt to be indicative of acute infarct. There is age related volume loss with slight periventricular small vessel disease. No mass or hemorrhage evident. Mild calcification in the right carotid siphon noted. Extensive paranasal sinus disease noted, most notably in the right sphenoid sinus.   Mr Brain Wo Contrast 05/28/2018 IMPRESSION: Nonhemorrhagic acute infarction affecting the RIGHT insula and RIGHT frontal lobe, primarily cortical in nature. Atrophy and small vessel disease. No proximal large vessel occlusion is evident.   Transthoracic Echocardiogram 05/20/2018 Study Conclusions - Left ventricle: The cavity size was normal. There was moderate   concentric hypertrophy. Systolic function was vigorous. The   estimated ejection fraction was in the range of 65% to 70%. Wall   motion was normal; there were no regional wall motion   abnormalities. Doppler parameters are consistent with abnormal   left ventricular relaxation (grade 1 diastolic dysfunction).   There was no evidence of elevated ventricular filling pressure by   Doppler parameters. - Aortic  valve: Valve area (VTI): 2.31 cm^2. Valve area (Vmax):   2.54 cm^2. Valve area (Vmean): 2.28 cm^2. - Aortic root: The aortic root was normal in size. - Mitral valve: There was mild regurgitation. - Right ventricle: Systolic function was normal. - Tricuspid valve: There was mild regurgitation. - Pulmonary arteries: Systolic pressure was within the normal   range. - Inferior vena cava: The vessel was normal in size. - Pericardium, extracardiac: There was no pericardial effusion. Impressions: - No cardiac source of emboli was  indentified.  TEE pending  PHYSICAL EXAM  Temp:  [97.3 F (36.3 C)-98.8 F (37.1 C)] 97.8 F (36.6 C) (07/21 1123) Pulse Rate:  [57-63] 61 (07/21 1123) Resp:  [16-20] 20 (07/21 1123) BP: (118-146)/(66-79) 118/68 (07/21 1123) SpO2:  [96 %-100 %] 100 % (07/21 1123)  General - Well nourished, well developed, in no apparent distress.  Ophthalmologic - fundi not visualized due to noncooperation.  Cardiovascular - Regular rate and rhythm with no murmur.  Mental Status -  Level of arousal and orientation to time, place, and person were intact. Language including expression, naming, repetition, comprehension was assessed and found intact.  Mild dysarthria Fund of Knowledge was assessed and was intact.  Cranial Nerves II - XII - II - Visual field intact OU. III, IV, VI - Extraocular movements intact. V - Facial sensation intact bilaterally. VII - left facial droop. VIII - Hearing & vestibular intact bilaterally. X - Palate elevates symmetrically, mild dysarthria. XI - Chin turning & shoulder shrug intact bilaterally. XII - Tongue protrusion intact.  Motor Strength - The patient's strength was normal in all extremities and pronator drift was absent.  Bulk was normal and fasciculations were absent.   Motor Tone - Muscle tone was assessed at the neck and appendages and was normal.  Reflexes - The patient's reflexes were symmetrical in all extremities and he had no pathological reflexes.  Sensory - Light touch, temperature/pinprick were assessed and were symmetrical.    Coordination - The patient had normal movements in the hands and feet with no ataxia or dysmetria.  Tremor was absent.  Gait and Station - deferred.  ASSESSMENT/PLAN [name] is a 82 y.o. male with history of diabetes mellitus, hypertension, hyperlipidemia, and arthritis presenting with left facial droop, dysarthria, and left hand numbness. He did not receive IV t-PA due to late presentation.  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  Resultant left facial droop and mild dysarthria  CT head - Focal decreased attenuation in the right mid to superior frontal lobe, suspicious for early infarct in this area.  MRI head - Nonhemorrhagic acute infarction affecting the RIGHT insula and RIGHT frontal lobe, primarily cortical in nature.  CTA H&N - unremarkable.   2D Echo - EF 65 to 70%  Pt would like to stay for TEE and loop recorder on Monday for cardioembolic work-up-discussed with Dr. Sharrell KuGreg Taylor cardiologist to arrange  LDL - 68  HgbA1c - 6.6  VTE prophylaxis - SCDs  aspirin 81 mg daily prior to admission, now on aspirin 81 and Plavix 75 DAPT for 3 months and then Plavix alone.  Patient counseled to be compliant with his antithrombotic medications  Ongoing aggressive stroke risk factor management  Therapy recommendations:  pending  Disposition:  Pending  Hypertension  Stable .  Permissive hypertension (OK if < 220/120) but gradually normalize in 5-7 days .  Long-term BP goal normotensive  Hyperlipidemia  Lipid lowering medication PTA:  none  LDL 68, goal < 70  Current lipid lowering medication: Lipitor 10 mg daily  Continue statin at discharge  Diabetes  HgbA1c 6.6, goal < 7.0  Controlled  SSI  CBG  Other Stroke Risk Factors  Advanced age  ETOH use, advised to drink no more than 1 alcoholic beverage per day.  Other Active Problems    Hospital day # 2 Continue  present treatment. Await TEE and loop recorder on Monday Delia Heady, MD Stroke Neurology 05/31/2018 1:03 PM  To contact Stroke Continuity provider, please refer to [email or web address]. After hours, contact General Neurology

## 2018-06-01 ENCOUNTER — Encounter (HOSPITAL_COMMUNITY): Payer: Self-pay | Admitting: *Deleted

## 2018-06-01 ENCOUNTER — Inpatient Hospital Stay (HOSPITAL_COMMUNITY): Payer: Medicare Other

## 2018-06-01 ENCOUNTER — Encounter (HOSPITAL_COMMUNITY)
Admission: EM | Disposition: A | Discharge status: Home / Self Care | Payer: Self-pay | Source: Home / Self Care | Attending: Family Medicine

## 2018-06-01 ENCOUNTER — Ambulatory Visit (HOSPITAL_COMMUNITY): Admit: 2018-06-01 | Payer: Medicare Other | Admitting: Internal Medicine

## 2018-06-01 ENCOUNTER — Other Ambulatory Visit: Payer: Self-pay | Admitting: Family Medicine

## 2018-06-01 DIAGNOSIS — M7989 Other specified soft tissue disorders: Secondary | ICD-10-CM

## 2018-06-01 DIAGNOSIS — I6389 Other cerebral infarction: Secondary | ICD-10-CM

## 2018-06-01 DIAGNOSIS — M79609 Pain in unspecified limb: Secondary | ICD-10-CM

## 2018-06-01 DIAGNOSIS — I340 Nonrheumatic mitral (valve) insufficiency: Secondary | ICD-10-CM

## 2018-06-01 HISTORY — PX: LOOP RECORDER INSERTION: EP1214

## 2018-06-01 HISTORY — PX: TEE WITHOUT CARDIOVERSION: SHX5443

## 2018-06-01 LAB — GLUCOSE, CAPILLARY
Glucose-Capillary: 123 mg/dL — ABNORMAL HIGH (ref 70–99)
Glucose-Capillary: 130 mg/dL — ABNORMAL HIGH (ref 70–99)
Glucose-Capillary: 93 mg/dL (ref 70–99)

## 2018-06-01 SURGERY — LOOP RECORDER INSERTION

## 2018-06-01 SURGERY — ECHOCARDIOGRAM, TRANSESOPHAGEAL
Anesthesia: Moderate Sedation

## 2018-06-01 MED ORDER — SENNOSIDES-DOCUSATE SODIUM 8.6-50 MG PO TABS: 1.0000 | ORAL_TABLET | Freq: Every evening | ORAL | 0 refills | Status: DC | PRN

## 2018-06-01 MED ORDER — ASPIRIN 81 MG PO TBEC: 81.0000 mg | DELAYED_RELEASE_TABLET | Freq: Every day | ORAL | 0 refills | Status: DC

## 2018-06-01 MED ORDER — ADULT MULTIVITAMIN W/MINERALS CH: 1.0000 | ORAL_TABLET | Freq: Every day | ORAL | 0 refills | Status: AC

## 2018-06-01 MED ORDER — LIDOCAINE-EPINEPHRINE 1 %-1:100000 IJ SOLN
INTRAMUSCULAR | Status: DC | PRN
  Administered 2018-06-01: 20 mL

## 2018-06-01 MED ORDER — LIDOCAINE-EPINEPHRINE 1 %-1:100000 IJ SOLN
INTRAMUSCULAR | Status: AC
  Filled 2018-06-01: qty 1

## 2018-06-01 MED ORDER — ATORVASTATIN CALCIUM 10 MG PO TABS: 10.0000 mg | ORAL_TABLET | Freq: Every day | ORAL | 0 refills | Status: DC

## 2018-06-01 MED ORDER — BUTAMBEN-TETRACAINE-BENZOCAINE 2-2-14 % EX AERO
INHALATION_SPRAY | CUTANEOUS | Status: DC | PRN
  Administered 2018-06-01: 2 via TOPICAL

## 2018-06-01 MED ORDER — FENTANYL CITRATE (PF) 100 MCG/2ML IJ SOLN
INTRAMUSCULAR | Status: AC
  Filled 2018-06-01: qty 2

## 2018-06-01 MED ORDER — MIDAZOLAM HCL 5 MG/ML IJ SOLN
INTRAMUSCULAR | Status: AC
  Filled 2018-06-01: qty 2

## 2018-06-01 MED ORDER — CLOPIDOGREL BISULFATE 75 MG PO TABS: 75.0000 mg | ORAL_TABLET | Freq: Every day | ORAL | 0 refills | Status: DC

## 2018-06-01 MED ORDER — SODIUM CHLORIDE 0.9 % IV SOLN: INTRAVENOUS | Status: DC

## 2018-06-01 MED ORDER — MIDAZOLAM HCL 10 MG/2ML IJ SOLN
INTRAMUSCULAR | Status: DC | PRN
  Administered 2018-06-01 (×2): 2 mg via INTRAVENOUS

## 2018-06-01 MED ORDER — FENTANYL CITRATE (PF) 100 MCG/2ML IJ SOLN
INTRAMUSCULAR | Status: DC | PRN
  Administered 2018-06-01: 25 ug via INTRAVENOUS

## 2018-06-01 SURGICAL SUPPLY — 2 items
LOOP REVEAL LINQSYS (Prosthesis & Implant Heart) ×1 IMPLANT
PACK LOOP INSERTION (CUSTOM PROCEDURE TRAY) ×2 IMPLANT

## 2018-06-01 NOTE — Progress Notes (Addendum)
Preliminary notes--Bilateral lower extremities venous duplex exam completed. Negative for DVT.  A non-vascular complex cystic structure measuring 0.88x1.44x0.87cm seen at right popliteal fossa medially.  Hongying Shawnna Pancake (RDMS RVT) 06/01/18'10:43 AM

## 2018-06-01 NOTE — Plan of Care (Signed)
Patient stable, discussed POC with patient, [facility], tol well, denies question/concerns at this time..Jonathan Daniels

## 2018-06-01 NOTE — Discharge Instructions (Signed)
Post implant care instructions °Keep incision clean and dry for 3 days. °You can remove outer dressing tomorrow. °Leave steri-strips (little pieces of tape) on until seen in the office for wound check appointment. °Call the office ([phone]) for redness, drainage, swelling, or fever. ° °

## 2018-06-01 NOTE — H&P (Signed)
Jonathan FergusonJames Dillin Jr. is a 82 y.o. male who has presented today for surgery, with the diagnosis of stroke. The various methods of treatment have been discussed with the patient and family. After consideration of risks, benefits and other options for treatment, the patient has consented to Procedure(s): TRANSESOPHAGEAL ECHOCARDIOGRAM (TEE) (N/A) as a surgical intervention . The patient's history has been reviewed, patient examined, no change in status, stable for surgery. I have reviewed the patient's chart and labs. Questions were answered to the patient's satisfaction.   Jonathan Memmer C. Duke Salviaandolph, MD, [facility]  06/01/2018 8:06 AM

## 2018-06-01 NOTE — Progress Notes (Signed)
FPTS Interim Progress Note  S:Patient denies any current complaints.  States that he would like to go home.  Has received loop recorder.  O: BP 135/63 (BP Location: Left Arm)   Pulse 73   Temp 97.9 F (36.6 C) (Oral)   Resp 20   Ht 5\' 5"  (1.651 m)   Wt 144 lb 2.9 oz (65.4 kg)   SpO2 98%   BMI 23.99 kg/m   Physical Exam  Constitutional: No distress.  Cardiovascular: Normal rate and regular rhythm. Exam reveals friction rub. Exam reveals no gallop.  No murmur heard. Pulmonary/Chest: Effort normal and breath sounds normal. No respiratory distress. He has no wheezes. He has no rales.  Abdominal: Soft. Bowel sounds are normal. He exhibits no distension. There is no tenderness.  Musculoskeletal:  5/5 strength BUE, BLE  Neurological:  Stable left sided facial droop, slight slurred speech, otherwise neurologically intact   Skin: He is not diaphoretic.    A/P: See progress note from today Spoke with Neuro, patient okay for discharge home.   Follow up with stroke clinic in 6 weeks as outpatient.  Rittberger, Solmon IceBailey J, DO 06/01/2018, 4:34 PM PGY-1, [facility] pager [phone]

## 2018-06-01 NOTE — CV Procedure (Signed)
Brief TEE Note  LVEF 60-65% No LA/LAA thrombus or mass Mild MR Trivial TR Mild atherosclerosis of the descending aorta and aortic arch Intra-atrial septal aneurysm PFO noted by saline microcavitation study  During this procedure the patient is administered a total of Versed 4 mg and Fentanyl 25 mcg to achieve and maintain moderate conscious sedation.  The patient's heart rate, blood pressure, and oxygen saturation are monitored continuously during the procedure. The period of conscious sedation is 17 minutes, of which I was present face-to-face 100% of this time.  For additional detail see full report.  Jaquae Rieves C. Duke Salviaandolph, MD, [facility] 06/01/2018 8:54 AM

## 2018-06-01 NOTE — Progress Notes (Signed)
STROKE TEAM PROGRESS NOTE   SUBJECTIVE (INTERVAL HISTORY) A family member is at the bedside.  He states he  is improving but still has left facial droop and mild slurred speech.  Left hand numbness resolved    TEE  was unremarkable and loop recorder is pending  OBJECTIVE Temp:  [97.5 F (36.4 C)-98.4 F (36.9 C)] 97.7 F (36.5 C) (07/22 1249) Pulse Rate:  [55-82] 82 (07/22 1440) Cardiac Rhythm: Normal sinus rhythm (07/21 1937) Resp:  [12-20] 20 (07/22 1440) BP: (106-201)/(40-86) 136/40 (07/22 1440) SpO2:  [93 %-100 %] 100 % (07/22 1440)  CBC:  Recent Labs  Lab 05/28/18 0956  05/30/18 0352 05/31/18 0302  WBC 8.6   < > 7.6 9.1  NEUTROABS 5.8  --   --   --   HGB 12.8*   < > 11.6* 11.7*  HCT 39.8   < > 35.7* 36.1*  MCV 87.1   < > 86.2 85.5  PLT 197   < > 190 179   < > = values in this interval not displayed.    Basic Metabolic Panel:  Recent Labs  Lab 05/30/18 0352 05/31/18 0302  NA 140 141  K 3.8 3.7  CL 107 109  CO2 25 26  GLUCOSE 154* 123*  BUN 19 21  CREATININE 1.24 1.23  CALCIUM 9.1 9.2    Lipid Panel:     Component Value Date/Time   CHOL 117 05/29/2018 0353   TRIG 124 05/29/2018 0353   HDL 24 (L) 05/29/2018 0353   CHOLHDL 4.9 05/29/2018 0353   VLDL 25 05/29/2018 0353   LDLCALC 68 05/29/2018 0353   HgbA1c:  Lab Results  Component Value Date   HGBA1C 6.6 (H) 05/29/2018   Urine Drug Screen: No results found for: LABOPIA, COCAINSCRNUR, LABBENZ, AMPHETMU, THCU, LABBARB  Alcohol Level No results found for: ETH  IMAGING I have personally reviewed the radiological images below and agree with the radiology interpretations.  Ct Angio Head W Or Wo Contrast Ct Angio Neck W Or Wo Contrast 05/29/2018 IMPRESSION:  1. Patent carotid and vertebral arteries. No dissection, aneurysm, or hemodynamically significant stenosis utilizing NASCET criteria.  2. Patent anterior and posterior intracranial circulation. No large vessel occlusion, aneurysm, or significant  stenosis.  3. Non stenotic beaded irregularity of right mid cervical ICA may be due to atherosclerotic changes or fibromuscular dysplasia.   Ct Head Wo Contrast 05/28/2018 IMPRESSION:  Focal decreased attenuation in the right mid to superior frontal lobe, suspicious for early infarct in this area. No other findings which are felt to be indicative of acute infarct. There is age related volume loss with slight periventricular small vessel disease. No mass or hemorrhage evident. Mild calcification in the right carotid siphon noted. Extensive paranasal sinus disease noted, most notably in the right sphenoid sinus.   Mr Brain Wo Contrast 05/28/2018 IMPRESSION: Nonhemorrhagic acute infarction affecting the RIGHT insula and RIGHT frontal lobe, primarily cortical in nature. Atrophy and small vessel disease. No proximal large vessel occlusion is evident.   Transthoracic Echocardiogram 05/20/2018 Study Conclusions - Left ventricle: The cavity size was normal. There was moderate   concentric hypertrophy. Systolic function was vigorous. The   estimated ejection fraction was in the range of 65% to 70%. Wall motion was normal; there were no regional wall motion   abnormalities. Doppler parameters are consistent with abnormal   left ventricular relaxation (grade 1 diastolic dysfunction).   There was no evidence of elevated ventricular filling pressure by   Doppler parameters. -  Aortic valve: Valve area (VTI): 2.31 cm^2. Valve area (Vmax):   2.54 cm^2. Valve area (Vmean): 2.28 cm^2. - Aortic root: The aortic root was normal in size. - Mitral valve: There was mild regurgitation. - Right ventricle: Systolic function was normal. - Tricuspid valve: There was mild regurgitation. - Pulmonary arteries: Systolic pressure was within the normal   range. - Inferior vena cava: The vessel was normal in size. - Pericardium, extracardiac: There was no pericardial effusion. Impressions: - No cardiac source of emboli  was indentified.  TEE pending  PHYSICAL EXAM  Temp:  [97.5 F (36.4 C)-98.4 F (36.9 C)] 97.7 F (36.5 C) (07/22 1249) Pulse Rate:  [55-82] 82 (07/22 1440) Resp:  [12-20] 20 (07/22 1440) BP: (106-201)/(40-86) 136/40 (07/22 1440) SpO2:  [93 %-100 %] 100 % (07/22 1440)  General - Well nourished, well developed, in no apparent distress.  Ophthalmologic - fundi not visualized due to noncooperation.  Cardiovascular - Regular rate and rhythm with no murmur.  Mental Status -  Level of arousal and orientation to time, place, and person were intact. Language including expression, naming, repetition, comprehension was assessed and found intact.  Mild dysarthria Fund of Knowledge was assessed and was intact.  Cranial Nerves II - XII - II - Visual field intact OU. III, IV, VI - Extraocular movements intact. V - Facial sensation intact bilaterally. VII - left facial droop. VIII - Hearing & vestibular intact bilaterally. X - Palate elevates symmetrically, mild dysarthria. XI - Chin turning & shoulder shrug intact bilaterally. XII - Tongue protrusion intact.  Motor Strength - The patient's strength was normal in all extremities and pronator drift was absent.  Bulk was normal and fasciculations were absent.   Motor Tone - Muscle tone was assessed at the neck and appendages and was normal.  Reflexes - The patient's reflexes were symmetrical in all extremities and he had no pathological reflexes.  Sensory - Light touch, temperature/pinprick were assessed and were symmetrical.    Coordination - The patient had normal movements in the hands and feet with no ataxia or dysmetria.  Tremor was absent.  Gait and Station - deferred.  ASSESSMENT/PLAN [name]. is a 82 y.o. male with history of diabetes mellitus, hypertension, hyperlipidemia, and arthritis presenting with left facial droop, dysarthria, and left hand numbness. He did not receive IV t-PA due to late  presentation.  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  Resultant left facial droop and mild dysarthria  CT head - Focal decreased attenuation in the right mid to superior frontal lobe, suspicious for early infarct in this area.  MRI head - Nonhemorrhagic acute infarction affecting the RIGHT insula and RIGHT frontal lobe, primarily cortical in nature.  CTA H&N - unremarkable.   2D Echo - EF 65 to 70%  Pt would like to stay for TEE and loop recorder on Monday for cardioembolic work-up-discussed with Dr. Sharrell Ku cardiologist to arrange  LDL - 68  HgbA1c - 6.6  VTE prophylaxis - SCDs  aspirin 81 mg daily prior to admission, now on aspirin 81 and Plavix 75 DAPT for 3 months and then Plavix alone.  Patient counseled to be compliant with his antithrombotic medications  Ongoing aggressive stroke risk factor management  Therapy recommendations:  pending  Disposition:  Pending  Hypertension  Stable .  Permissive hypertension (OK if < 220/120) but gradually normalize in 5-7 days .  Long-term BP goal normotensive  Hyperlipidemia  Lipid lowering medication PTA:  none  LDL 68, goal < 70  Current lipid lowering medication: Lipitor 10 mg daily  Continue statin at discharge  Diabetes  HgbA1c 6.6, goal < 7.0  Controlled  SSI  CBG  Other Stroke Risk Factors  Advanced age  ETOH use, advised to drink no more than 1 alcoholic beverage per day.  Other Active Problems    Hospital day # 3 Continue  present treatment.   loop recorder placement Stroke team will sign off. Kindly call for questions. Follow-up as an outpatient in stroke clinic in 6 weeks Delia HeadyPramod Kale Rondeau, MD Stroke Neurology 06/01/2018 4:12 PM  To contact Stroke Continuity provider, please refer to [email or web address]. After hours, contact General Neurology

## 2018-06-01 NOTE — Progress Notes (Signed)
D/C instructions provided to patient and spouse, denies questions/concerns at this time. Patient transported to front entrance via WC, tol well. 

## 2018-06-01 NOTE — Progress Notes (Signed)
  Echocardiogram 2D Echocardiogram has been performed.  Jonathan SavoyCasey N Karyme Daniels 06/01/2018, 9:42 AM

## 2018-06-01 NOTE — Consult Note (Addendum)
ELECTROPHYSIOLOGY CONSULT NOTE  Patient ID: Jonathan FergusonJames Parrillo Jr. MRN: [id number], DOB/AGE: 80/01/1936   Admit date: 05/28/2018 Date of Consult: 06/01/2018  [facility] Primary Cardiologist: none Reason for Consultation: Cryptogenic stroke ; recommendations regarding Implantable Loop Recorder, requested by Dr. Pearlean BrownieSethi  History of Present Illness Jonathan FergusonJames Rodocker Jr. was admitted on 05/28/2018 with acute CVA.  PMHx noted for DM, HTN, HLD, arthritis.  He first developed of slurred speech, facial droop, L hand numbness while driving with his wife.  This seemed to be transient on Wed, though Thursday became more persistent and came in.  Imaging demonstratedacute infarction affecting the Rt insula and Rt frontal lobe - likely embolic, etiology unknown.  he has undergone workup for stroke including echocardiogram and carotid angio.  The patient has been monitored on telemetry which has demonstrated sinus rhythm with no arrhythmias.  Inpatient stroke work-up is to be completed with a TEE.   Echocardiogram this admission demonstrated  7/191/19   Study Conclusions - Left ventricle: The cavity size was normal. There was moderate   concentric hypertrophy. Systolic function was vigorous. The   estimated ejection fraction was in the range of 65% to 70%. Wall   motion was normal; there were no regional wall motion   abnormalities. Doppler parameters are consistent with abnormal   left ventricular relaxation (grade 1 diastolic dysfunction).   There was no evidence of elevated ventricular filling pressure by   Doppler parameters. - Aortic valve: Valve area (VTI): 2.31 cm^2. Valve area (Vmax):   2.54 cm^2. Valve area (Vmean): 2.28 cm^2. - Aortic root: The aortic root was normal in size. - Mitral valve: There was mild regurgitation. - Right ventricle: Systolic function was normal. - Tricuspid valve: There was mild regurgitation. - Pulmonary arteries: Systolic pressure was within the  normal   range. - Inferior vena cava: The vessel was normal in size. - Pericardium, extracardiac: There was no pericardial effusion. Impressions: - No cardiac source of emboli was indentified.  Lab work is reviewed.  Prior to admission, the patient denies chest pain, shortness of breath, dizziness, palpitations, or syncope.  They are recovering from their stroke with plans to home at discharge.   Past Medical History:  Diagnosis Date  . Arthritis    "right knee" (05/28/2018)  . BPH (benign prostatic hyperplasia)    but not on any meds  . CVA (cerebral vascular accident) (HCC) 05/28/2018   "left mouth droopy" (05/28/2018)  . GERD (gastroesophageal reflux disease)   . Glaucoma    uses eye drops daily  . Hyperlipidemia    was on med but has been off for a yr-medical md is aware  . Hypertension    was on med but has been off a yr-medical md is aware  . Joint swelling   . Primary osteoarthritis of left hip 05/27/2013  . Type II diabetes mellitus (HCC)    only takes Cinnamon  . Urinary frequency      Surgical History:  Past Surgical History:  Procedure Laterality Date  . CARDIAC CATHETERIZATION  2011  . COLONOSCOPY    . JOINT REPLACEMENT    . PATELLAR TENDON REPAIR Bilateral   . SHOULDER OPEN ROTATOR CUFF REPAIR Right   . TOTAL HIP ARTHROPLASTY Left 02/08/2014   Procedure: TOTAL HIP ARTHROPLASTY;  Surgeon: Eulas PostJoshua P Landau, MD;  Location: [facility] OR;  Service: Orthopedics;  Laterality: Left;     Medications Prior to Admission  Medication Sig Dispense Refill Last Dose  . aspirin  81 MG tablet Take 81 mg by mouth daily.     Past Month at Unknown time  . bimatoprost (LUMIGAN) 0.03 % ophthalmic solution Place 1 drop into both eyes at bedtime.   05/27/2018 at Unknown time  . brimonidine-timolol (COMBIGAN) 0.2-0.5 % ophthalmic solution Place 1 drop into both eyes every 12 (twelve) hours.   05/28/2018 at Unknown time  . CINNAMON PO Take 2 capsules by mouth daily.   05/27/2018 at Unknown time   . Glucosamine-Chondroitin (GLUCOSAMINE CHONDR COMPLEX PO) Take by mouth.     05/27/2018 at Unknown time  . ibuprofen (ADVIL,MOTRIN) 200 MG tablet Take 400 mg by mouth every 6 (six) hours as needed.   Past Week at Unknown time  . Multiple Vitamin (MULTIVITAMIN) tablet Take 1 tablet by mouth daily.     05/27/2018 at Unknown time  . baclofen (LIORESAL) 10 MG tablet Take 1 tablet (10 mg total) by mouth 3 (three) times daily. As needed for muscle spasm (Patient not taking: Reported on 05/28/2018) 50 each 0 Not Taking at Unknown time  . HYDROcodone-acetaminophen (NORCO) 10-325 MG per tablet Take 1-2 tablets by mouth every 6 (six) hours as needed. (Patient not taking: Reported on 05/28/2018) 75 tablet 0 Not Taking at Unknown time  . sennosides-docusate sodium (SENOKOT-S) 8.6-50 MG tablet Take 2 tablets by mouth daily. (Patient not taking: Reported on 05/28/2018) 30 tablet 1 Not Taking at Unknown time    Inpatient Medications:  .  stroke: mapping our early stages of recovery book   Does not apply Once  . aspirin EC  81 mg Oral Daily  . atorvastatin  10 mg Oral q1800  . brimonidine  1 drop Both Eyes BID   And  . timolol  1 drop Both Eyes BID  . clopidogrel  75 mg Oral Daily  . insulin aspart  0-9 Units Subcutaneous TID WC  . latanoprost  1 drop Both Eyes QHS  . multivitamin with minerals  1 tablet Oral Daily    Allergies: No Known Allergies  Social History   Socioeconomic History  . Marital status: Married    Spouse name: Not on file  . Number of children: Not on file  . Years of education: Not on file  . Highest education level: Not on file  Occupational History  . Not on file  Social Needs  . Financial resource strain: Not on file  . Food insecurity:    Worry: Not on file    Inability: Not on file  . Transportation needs:    Medical: Not on file    Non-medical: Not on file  Tobacco Use  . Smoking status: Never Smoker  . Smokeless tobacco: Never Used  Substance and Sexual Activity    . Alcohol use: Yes    Comment: 05/28/2018 "couple times/year"  . Drug use: Never  . Sexual activity: Yes  Lifestyle  . Physical activity:    Days per week: Not on file    Minutes per session: Not on file  . Stress: Not on file  Relationships  . Social connections:    Talks on phone: Not on file    Gets together: Not on file    Attends religious service: Not on file    Active member of club or organization: Not on file    Attends meetings of clubs or organizations: Not on file    Relationship status: Not on file  . Intimate partner violence:    Fear of current or ex partner: Not on  file    Emotionally abused: Not on file    Physically abused: Not on file    Forced sexual activity: Not on file  Other Topics Concern  . Not on file  Social History Narrative  . Not on file     Family History  Problem Relation Age of Onset  . Kidney disease Other   . Diabetes Father   . Cancer Neg Hx   . Early death Neg Hx   . Heart disease Neg Hx   . Hypertension Neg Hx   . Hyperlipidemia Neg Hx   . Stroke Neg Hx       Review of Systems: All other systems reviewed and are otherwise negative except as noted above.  Physical Exam: Vitals:   05/31/18 1123 05/31/18 1657 05/31/18 2017 06/01/18 0439  BP: 118/68 132/78 135/66 (!) 113/58  Pulse: 61 (!) 55 63 (!) 58  Resp: 20 16 20 20   Temp: 97.8 F (36.6 C) 98.4 F (36.9 C) 98 F (36.7 C) 98.3 F (36.8 C)  TempSrc:  Oral Oral Oral  SpO2: 100% 96% 96% 96%  Weight:      Height:        GEN- The patient is well appearing, alert and oriented x 3 today.   Head- normocephalic, atraumatic Eyes-  Sclera clear, conjunctiva pink Ears- hearing intact Oropharynx- clear Neck- supple Lungs- CTA b/l, normal work of breathing Heart- RRR, no murmurs, rubs or gallops  GI- soft, NT, ND Extremities- no clubbing, cyanosis, or edema MS- no significant deformity or atrophy Skin- no rash or lesion Psych- euthymic mood, full affect   Labs:    Lab Results  Component Value Date   WBC 9.1 05/31/2018   HGB 11.7 (L) 05/31/2018   HCT 36.1 (L) 05/31/2018   MCV 85.5 05/31/2018   PLT 179 05/31/2018    Recent Labs  Lab 05/28/18 0956  05/31/18 0302  NA 139   < > 141  K 4.0   < > 3.7  CL 107   < > 109  CO2 23   < > 26  BUN 12   < > 21  CREATININE 1.27*   < > 1.23  CALCIUM 9.6   < > 9.2  PROT 6.6  --   --   BILITOT 1.0  --   --   ALKPHOS 80  --   --   ALT 20  --   --   AST 24  --   --   GLUCOSE 174*   < > 123*   < > = values in this interval not displayed.   Lab Results  Component Value Date   CKTOTAL 257 (H) 09/14/2010   CKMB 4.5 (H) 09/14/2010   TROPONINI 0.01        NO INDICATION OF MYOCARDIAL INJURY. 09/14/2010   Lab Results  Component Value Date   CHOL 117 05/29/2018   CHOL 143 05/27/2013   CHOL 161 10/30/2011   Lab Results  Component Value Date   HDL 24 (L) 05/29/2018   HDL 30.20 (L) 05/27/2013   HDL 32.00 (L) 10/30/2011   Lab Results  Component Value Date   LDLCALC 68 05/29/2018   LDLCALC 86 05/27/2013   LDLCALC 106 (H) 10/30/2011   Lab Results  Component Value Date   TRIG 124 05/29/2018   TRIG 133.0 05/27/2013   TRIG 113.0 10/30/2011   Lab Results  Component Value Date   CHOLHDL 4.9 05/29/2018   CHOLHDL 5 05/27/2013  CHOLHDL 5 10/30/2011   No results found for: LDLDIRECT  No results found for: DDIMER   Radiology/Studies:   Ct Angio Head W Or Wo Contrast Result Date: 05/29/2018 CLINICAL DATA:  82 y/o  M; stroke for follow-up. EXAM: CT ANGIOGRAPHY HEAD AND NECK TECHNIQUE: Multidetector CT imaging of the head and neck was performed using the standard protocol during bolus administration of intravenous contrast. Multiplanar CT image reconstructions and MIPs were obtained to evaluate the vascular anatomy. Carotid stenosis measurements (when applicable) are obtained utilizing NASCET criteria, using the distal internal carotid diameter as the denominator. CONTRAST:  50 cc Isovue 370  COMPARISON:  05/28/2018 MRI head. FINDINGS: CTA NECK FINDINGS Aortic arch: Bovine variant branching. Imaged portion shows no evidence of aneurysm or dissection. No significant stenosis of the major arch vessel origins. Mild calcific atherosclerosis. Right carotid system: No evidence of dissection, stenosis (50% or greater) or occlusion. Non stenotic beaded irregularity of the right mid ICA. Left carotid system: No evidence of dissection, stenosis (50% or greater) or occlusion. Vertebral arteries: Right dominant. No evidence of dissection, stenosis (50% or greater) or occlusion. Skeleton: Mild cervical spondylosis with multilevel disc and facet degenerative changes greatest at the C4-C6 levels. No high-grade bony canal stenosis. Other neck: Negative. Upper chest: Negative. Review of the MIP images confirms the above findings CTA HEAD FINDINGS Anterior circulation: No significant stenosis, proximal occlusion, aneurysm, or vascular malformation. Posterior circulation: No significant stenosis, proximal occlusion, aneurysm, or vascular malformation. Venous sinuses: As permitted by contrast timing, patent. Anatomic variants: Complete circle-of-Willis. Delayed phase: No abnormal intracranial enhancement. Review of the MIP images confirms the above findings IMPRESSION: 1. Patent carotid and vertebral arteries. No dissection, aneurysm, or hemodynamically significant stenosis utilizing NASCET criteria. 2. Patent anterior and posterior intracranial circulation. No large vessel occlusion, aneurysm, or significant stenosis. 3. Non stenotic beaded irregularity of right mid cervical ICA may be due to atherosclerotic changes or fibromuscular dysplasia. Electronically Signed   By: Mitzi Hansen M.D.   On: 05/29/2018 05:52    Ct Head Wo Contrast Result Date: 05/28/2018 CLINICAL DATA:  Slurred speech with left-sided facial droop EXAM: CT HEAD WITHOUT CONTRAST TECHNIQUE: Contiguous axial images were obtained from the  base of the skull through the vertex without intravenous contrast. COMPARISON:  None. FINDINGS: Brain: There is age related volume loss. There is no mass, hemorrhage, extra-axial fluid collection, or midline shift. There is focal decreased attenuation in the posterior mid to superior right frontal lobe, an appearance suspicious for acute infarct. This appearance is best seen on axial slice 20 series 3, coronal slice 35 series 5, and sagittal slice 16 series 6. Elsewhere, there is mild small vessel disease in the centra semiovale bilaterally. Small foci of basal ganglia calcification are likely physiologic. Vascular: There is no appreciable hyperdense vessel. There is mild calcification in the right carotid siphon region. Skull: Bony calvarium appears intact. Sinuses/Orbits: There is extensive opacification in the right sphenoid sinus region. There is mucosal thickening and opacification in several ethmoid air cells bilaterally. Visualized orbits appear symmetric bilaterally. Other: Visualized mastoid air cells are clear. IMPRESSION: Focal decreased attenuation in the right mid to superior frontal lobe, suspicious for early infarct in this area. No other findings which are felt to be indicative of acute infarct. There is age related volume loss with slight periventricular small vessel disease. No mass or hemorrhage evident. Mild calcification in the right carotid siphon noted. Extensive paranasal sinus disease noted, most notably in the right sphenoid sinus. These results were  called by telephone at the time of interpretation on 05/28/2018 at 11:57 am to Dr. Gerhard Munch , who verbally acknowledged these results. Electronically Signed   By: Bretta Bang III M.D.   On: 05/28/2018 11:57     Mr Brain Wo Contrast Result Date: 05/28/2018 CLINICAL DATA:  Dysarthria, and LEFT facial droop upon awakening. EXAM: MRI HEAD WITHOUT CONTRAST TECHNIQUE: Multiplanar, multiecho pulse sequences of the brain and surrounding  structures were obtained without intravenous contrast. COMPARISON:  CT head earlier today. FINDINGS: The patient would not allow completion of the scan. Axial T1 weighted images were omitted. Overall the study is diagnostic. Brain: Multifocal areas of restricted diffusion, RIGHT MCA distribution, affect the RIGHT insula, frontal operculum, as well as the prefrontal gyrus over the convexity, corresponding low ADC, consistent with acute infarction. These are primarily cortical in nature, although some subcortical white matter may be affected. No similar findings on the LEFT or in the posterior circulation. No hemorrhage, mass lesion, hydrocephalus, or extra-axial fluid. Cerebral and cerebellar atrophy. Mild subcortical and periventricular T2 and FLAIR hyperintensities, likely chronic microvascular ischemic change. Small BILATERAL chronic thalamic infarcts. Vascular: Flow voids are maintained. There are no areas of chronic hemorrhage. Skull and upper cervical spine: Normal marrow signal. Sinuses/Orbits: Negative. Other: Compared with recent CT, there is good general agreement. IMPRESSION: Nonhemorrhagic acute infarction affecting the RIGHT insula and RIGHT frontal lobe, primarily cortical in nature. Atrophy and small vessel disease. No proximal large vessel occlusion is evident. Electronically Signed   By: Elsie Stain M.D.   On: 05/28/2018 15:26    12-lead ECG SR All prior EKG's in EPIC reviewed with no documented atrial fibrillation  Telemetry SR  Assessment and Plan:  1. Cryptogenic stroke The patient presents with cryptogenic stroke.  The patient has a TEE planned for this AM.  I spoke at length with the patient about monitoring for afib with either a 30 day event monitor or an implantable loop recorder.  Risks, benefits, and alteratives to implantable loop recorder were discussed with the patient today.   At this time, the patient is very clear in his decision to proceed with implantable loop recorder.    Wound care was reviewed with the patient (keep incision clean and dry for 3 days).  Wound check will be scheduled for the patient  Please call with questions.   Sheilah Pigeon, PA-C 06/01/2018  Pt seen, interviewed and exzamined  For Linq today to evaluate for Cryptogenic Stroke

## 2018-06-01 NOTE — Progress Notes (Signed)
Physical Therapy Treatment Patient Details Name: Jonathan FergusonJames Watlington Jr. MRN: [id number] DOB: 10/31/1936 Today's Date: 06/01/2018    History of Present Illness Jonathan ShowsJames Dziedzic Danielsis a 82 y.o.malepresenting with facial droop and dysarthria, L hand numbness. PMH is significant forhypertension, T2DM, GERD, and Hip arthroplasty. MRI revealed Nonhemorrhagic acute infarction affecting the RIGHT insula and RIGHT frontal lobe, primarily cortical in nature.    PT Comments    Patient continues to make progress toward PT goals. Current plan remains appropriate.    Follow Up Recommendations  Outpatient PT     Equipment Recommendations  None recommended by PT    Recommendations for Other Services       Precautions / Restrictions Precautions Precautions: Fall Restrictions Weight Bearing Restrictions: No    Mobility  Bed Mobility Overal bed mobility: Modified Independent Bed Mobility: Supine to Sit              Transfers Overall transfer level: Needs assistance Equipment used: None Transfers: Sit to/from Stand Sit to Stand: Min guard;Supervision         General transfer comment: for safety  Ambulation/Gait Ambulation/Gait assistance: Min guard;Supervision Gait Distance (Feet): 250 Feet Assistive device: None Gait Pattern/deviations: Step-through pattern;Decreased stride length;Drifts right/left;Trunk flexed     General Gait Details: unsteadiness noted with gait but no overt LOB   Stairs   Stairs assistance: Min guard Stair Management: Two rails;Alternating pattern;Forwards Number of Stairs: 10     Wheelchair Mobility    Modified Rankin (Stroke Patients Only) Modified Rankin (Stroke Patients Only) Pre-Morbid Rankin Score: No symptoms Modified Rankin: Moderately severe disability     Balance Overall balance assessment: Needs assistance Sitting-balance support: No upper extremity supported;Feet supported Sitting balance-Leahy Scale: Good     Standing balance  support: No upper extremity supported;During functional activity Standing balance-Leahy Scale: Fair                              Cognition Arousal/Alertness: Awake/alert Behavior During Therapy: WFL for tasks assessed/performed Overall Cognitive Status: Within Functional Limits for tasks assessed                                        Exercises      General Comments        Pertinent Vitals/Pain Pain Assessment: No/denies pain    Home Living                      Prior Function            PT Goals (current goals can now be found in the care plan section) Acute Rehab PT Goals Patient Stated Goal: "get home" PT Goal Formulation: With patient Time For Goal Achievement: 06/05/18 Potential to Achieve Goals: Good Progress towards PT goals: Progressing toward goals    Frequency    Min 4X/week      PT Plan Current plan remains appropriate    Co-evaluation              AM-PAC PT "6 Clicks" Daily Activity  Outcome Measure  Difficulty turning over in bed (including adjusting bedclothes, sheets and blankets)?: A Lot Difficulty moving from lying on back to sitting on the side of the bed? : A Little Difficulty sitting down on and standing up from a chair with arms (e.g., wheelchair, bedside commode, etc,.)?: Unable Help needed moving  to and from a bed to chair (including a wheelchair)?: A Little Help needed walking in hospital room?: A Little Help needed climbing 3-5 steps with a railing? : A Little 6 Click Score: 15    End of Session Equipment Utilized During Treatment: Gait belt Activity Tolerance: Patient tolerated treatment well Patient left: in chair;with call bell/phone within reach;with family/visitor present Nurse Communication: Mobility status PT Visit Diagnosis: Unsteadiness on feet (R26.81);Other abnormalities of gait and mobility (R26.89);Muscle weakness (generalized) (M62.81)     Time: 0981-1914 PT Time  Calculation (min) (ACUTE ONLY): 17 min  Charges:  $Gait Training: 8-22 mins                    G Codes:       Erline Levine, PTA Pager: [phone]     Carolynne Edouard 06/01/2018, 2:47 PM

## 2018-06-03 ENCOUNTER — Encounter (HOSPITAL_COMMUNITY): Payer: Self-pay | Admitting: Internal Medicine

## 2018-06-03 ENCOUNTER — Telehealth: Payer: Self-pay

## 2018-06-03 NOTE — Telephone Encounter (Signed)
Informed pt to follow up with his pcp, he has an apt 8/1.

## 2018-06-03 NOTE — Telephone Encounter (Signed)
I don't think this is related to the statin.  [facility].  If there are continued concerns, patient should be advised to go to the ED.

## 2018-06-03 NOTE — Telephone Encounter (Signed)
Pts wife called stating pt took his first does of lipitor last night and woke up this morning feeling dizzy with numb toes. Pt wife wants to know if this could be a side effect of the lipitor? Should he continue taking this medication? Next does is due for this evening. Please advise.

## 2018-06-04 ENCOUNTER — Encounter (HOSPITAL_COMMUNITY): Payer: Self-pay | Admitting: Cardiovascular Disease

## 2018-06-15 ENCOUNTER — Ambulatory Visit (INDEPENDENT_AMBULATORY_CARE_PROVIDER_SITE_OTHER): Payer: Self-pay | Admitting: *Deleted

## 2018-06-15 DIAGNOSIS — I639 Cerebral infarction, unspecified: Secondary | ICD-10-CM

## 2018-06-15 LAB — CUP PACEART INCLINIC DEVICE CHECK
MDC IDC PG IMPLANT DT: 20190722
MDC IDC SESS DTM: 20190805131659

## 2018-06-15 NOTE — Progress Notes (Signed)
Wound check in clinic s/p ILR implant. Steri strips removed prior to appt. Wound well healed without redness or edema. Incision edges approximated. Normal device function. Battery status: GOOD. R-waves 0.7290mV. 0 symptom episodes, 0 tachy episodes, 0 pause episodes, 0 brady episodes. 0 AF episodes (0% burden). Patient education completed including wound care, remote monitoring, and future follow up. Monthly summary reports and ROV with SK in 12 months.

## 2018-06-23 ENCOUNTER — Other Ambulatory Visit: Payer: Self-pay | Admitting: Family Medicine

## 2018-06-23 DIAGNOSIS — I6300 Cerebral infarction due to thrombosis of unspecified precerebral artery: Secondary | ICD-10-CM

## 2018-06-23 DIAGNOSIS — I639 Cerebral infarction, unspecified: Secondary | ICD-10-CM

## 2018-06-23 DIAGNOSIS — R471 Dysarthria and anarthria: Principal | ICD-10-CM

## 2018-06-23 NOTE — Progress Notes (Signed)
Order at discharge for PT canceled. Order for amb Speech Therapy ordered.

## 2018-07-06 ENCOUNTER — Ambulatory Visit (INDEPENDENT_AMBULATORY_CARE_PROVIDER_SITE_OTHER): Payer: Medicare Other | Admitting: *Deleted

## 2018-07-06 DIAGNOSIS — I639 Cerebral infarction, unspecified: Secondary | ICD-10-CM | POA: Diagnosis not present

## 2018-07-06 NOTE — Progress Notes (Signed)
[facility] Summary Report / Loop Recorder 

## 2018-07-21 ENCOUNTER — Ambulatory Visit: Payer: BLUE CROSS/BLUE SHIELD | Admitting: Neurology

## 2018-07-22 ENCOUNTER — Ambulatory Visit: Payer: Medicare Other | Attending: Family Medicine

## 2018-07-22 DIAGNOSIS — R471 Dysarthria and anarthria: Secondary | ICD-10-CM | POA: Insufficient documentation

## 2018-07-22 NOTE — Therapy (Signed)
[facility] [address] [place], [place], [address] Phone: [phone]   Fax:  [phone]  Speech Language Pathology Treatment  Patient Details  Name: Jonathan Daniels. MRN: [id number] Date of Birth: 01-09-1936 Referring Provider: Acquanetta Belling, MD   Encounter Date: 07/22/2018  End of Session - 07/22/18 1355    Visit Number  1    Number of Visits  1    Date for SLP Re-Evaluation  07/15/18    SLP Start Time  1319    SLP Stop Time   1352    SLP Time Calculation (min)  33 min    Activity Tolerance  Patient tolerated treatment well       Past Medical History:  Diagnosis Date  . Arthritis    "right knee" (05/28/2018)  . BPH (benign prostatic hyperplasia)    but not on any meds  . CVA (cerebral vascular accident) (HCC) 05/28/2018   "left mouth droopy" (05/28/2018)  . GERD (gastroesophageal reflux disease)   . Glaucoma    uses eye drops daily  . Hyperlipidemia    was on med but has been off for a yr-medical md is aware  . Hypertension    was on med but has been off a yr-medical md is aware  . Joint swelling   . Primary osteoarthritis of left hip 05/27/2013  . Type II diabetes mellitus (HCC)    only takes Cinnamon  . Urinary frequency     Past Surgical History:  Procedure Laterality Date  . CARDIAC CATHETERIZATION  2011  . COLONOSCOPY    . JOINT REPLACEMENT    . LOOP RECORDER INSERTION N/A 06/01/2018   Procedure: LOOP RECORDER INSERTION;  Surgeon: Duke Salvia, MD;  Location: [facility] INVASIVE CV LAB;  Service: Cardiovascular;  Laterality: N/A;  . PATELLAR TENDON REPAIR Bilateral   . SHOULDER OPEN ROTATOR CUFF REPAIR Right   . TEE WITHOUT CARDIOVERSION N/A 06/01/2018   Procedure: TRANSESOPHAGEAL ECHOCARDIOGRAM (TEE);  Surgeon: Chilton Si, MD;  Location: [facility] ENDOSCOPY;  Service: Cardiovascular;  Laterality: N/A;  . TOTAL HIP ARTHROPLASTY Left 02/08/2014   Procedure: TOTAL HIP ARTHROPLASTY;  Surgeon: Eulas Post, MD;  Location: [facility] OR;  Service: Orthopedics;  Laterality: Left;    There were no vitals filed for this visit.  Subjective Assessment - 07/22/18 1320    Subjective  Pt greeted another pt in waiting room.    Patient is accompained by:  Family member   wife       SLP Evaluation [facility] - 07/22/18 1326      SLP Visit Information   SLP Received On  07/22/18    Referring Provider  McDiarmid, Tawanna Cooler, MD    Onset Date  July 2019    Medical Diagnosis  CVA      Subjective   Subjective  Wife states voice is not as it was before. Pt states speech is approx 80% of premorbid speech.       General Information   HPI  --      Prior Functional Status   Cognitive/Linguistic Baseline  Within functional limits    Type of Home  House     Lives With  Spouse    Available Support  Family    Vocation  Retired      IT consultant   Overall Cognitive Status  Within Functional Limits for tasks assessed      Auditory Comprehension   Overall Auditory Comprehension  Appears within functional limits for tasks  assessed      Verbal Expression   Overall Verbal Expression  Appears within functional limits for tasks assessed      Oral Motor/Sensory Function   Overall Oral Motor/Sensory Function  Appears within functional limits for tasks assessed    Labial ROM  Reduced left   mild; improves with cue   Labial Symmetry  Abnormal symmetry left    Labial Strength  Within Functional Limits    Labial Sensation  Within Functional Limits    Labial Coordination  WFL    Lingual ROM  Within Functional Limits    Lingual Symmetry  Within Functional Limits    Lingual Strength  Within Functional Limits    Lingual Sensation  Within Functional Limits    Lingual Coordination  WFL    Velum  Within Functional Limits    Overall Oral Motor/Sensory Function  Slight reduced movement on labial retraction/smile, however, improved with cue      Motor Speech   Overall Motor Speech  Appears within functional limits for tasks  assessed    Respiration  Within functional limits    Phonation  Normal   "strong voice" AMs, sometimes weakens in PMs   Resonance  Within functional limits    Articulation  Within functional limitis    Intelligibility  Intelligible    Phonation  WFL   sust /a/ 20 seconds;           SLP Education - 07/22/18 1355    Education Details  Eval results, signs of CVA, CVA risk factors    Person(s) Educated  Patient;Spouse    Methods  Explanation;Handout    Comprehension  Verbalized understanding           Plan - 07/22/18 1356    Clinical Impression Statement  Pt presents today with reports of intermittent weak voice in PMs following CVA in July 2019. Oral strength appears WFL/WNL, sustained /a/ (20 seconds) and conversational (low 70s dB average) and max loudness (low 90s dB average) appear WNL. Pt denies current s/s dysphagia but endorses when he was d/c'd from hospital in July he coughed more frequently and had lt anterior labial leakage; pt reports all have now resolved. SLP educated pt/wife on CVA warning signs as well as CVA risk factors. Pt is not at a rehabilitative level to benefit from ST services at this time and will not be seen for therapy. Pt agrees with this conclusion.   Speech Therapy Frequency  One time visit    Consulted and Agree with Plan of Care  Patient       Patient will benefit from skilled therapeutic intervention in order to improve the following deficits and impairments:   Dysarthria and anarthria    Problem List Patient Active Problem List   Diagnosis Date Noted  . Stroke (HCC) 05/28/2018  . Dysarthria due to acute cerebrovascular accident (CVA) (HCC) 05/28/2018  . 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 Hip arthritis 02/08/2014  . Primary osteoarthritis of left hip 05/27/2013  . Dementia arising in the senium and presenium 05/27/2013  . Diabetes mellitus type II, controlled (HCC) 12/04/2011  . Pure  hypercholesterolemia 10/30/2011  . Glaucoma 10/30/2011  . PSA, INCREASED 11/26/2010  . Essential hypertension 09/24/2010  . GERD 09/24/2010  . HYPERTROPHY PROSTATE W/UR OBST & OTH LUTS 09/24/2010    SCHINKE,CARL ,MS, CCC-SLP  07/22/2018, 2:00 PM  [facility] [address] [place], [place], [address] Phone: [phone]  Fax:  [phone]   Name: Jonathan Daniels. MRN: [id number] Date of Birth: 1936-05-04

## 2018-07-22 NOTE — Patient Instructions (Signed)
Signs of a Stroke:  F-Face: Ask the person to smile. Does one side of the face droop?  A-Arms: Ask the person to raise both arms. Does one arm drift downward?  S-Speech: Ask the person to repeat a simple phrase. Is the speech slurred or strange?  T-Time: If you see any of these signs, call 9-1-1 right away.  Note the time when any symptoms first appear. This information helps health care providers determine the best treatment for each person. Do not drive to the hospital or let someone else drive you. Call an ambulance so that medical personnel can begin life-saving treatment on the way to the emergency room.

## 2018-07-24 ENCOUNTER — Telehealth: Payer: Self-pay | Admitting: *Deleted

## 2018-07-24 NOTE — Telephone Encounter (Signed)
Manual transmission received and reviewed. 2 "AF" episodes both appear ST vs AT with occasional PACs, rare undersensing noted. No true AF noted. ECGs printed and placed in Dr. Phyllis GingerKlein's LINQ folder for review.

## 2018-07-24 NOTE — Telephone Encounter (Signed)
Pt wife called I told her to have the pt to send a manual transmission. She may call back.

## 2018-07-24 NOTE — Telephone Encounter (Signed)
LMOM requesting call back to the [facility] as no DPR on file. Will request manual [facility]link transmission for review.

## 2018-07-24 NOTE — Telephone Encounter (Signed)
Spoke with patient, advised that transmission was successful and that Dr. Graciela HusbandsKlein will review strips. Explained we will call if any recommended changes. Patient appreciative of call and denies questions or concerns at this time.

## 2018-07-30 ENCOUNTER — Other Ambulatory Visit: Payer: Self-pay | Admitting: Internal Medicine

## 2018-08-04 LAB — CUP PACEART REMOTE DEVICE CHECK
Date Time Interrogation Session: 20190824194114
Implantable Pulse Generator Implant Date: 20190722

## 2018-08-06 ENCOUNTER — Ambulatory Visit (INDEPENDENT_AMBULATORY_CARE_PROVIDER_SITE_OTHER): Payer: Medicare Other | Admitting: *Deleted

## 2018-08-06 DIAGNOSIS — I639 Cerebral infarction, unspecified: Secondary | ICD-10-CM | POA: Diagnosis not present

## 2018-08-07 NOTE — Progress Notes (Signed)
[facility] Summary Report / Loop Recorder 

## 2018-08-10 LAB — CUP PACEART REMOTE DEVICE CHECK
Date Time Interrogation Session: 20190926221000
MDC IDC PG IMPLANT DT: 20190722

## 2018-09-03 ENCOUNTER — Telehealth: Payer: Self-pay

## 2018-09-03 NOTE — Telephone Encounter (Signed)
Attending Physician statement form dropped off for at front desk for completion. This patient was seen by you in the hospital.  Patient states that you informed them to come here and drop the form off.  However, the patient is not a patient at [facility] medicine center.  This is not the normal the protocol.   Therefore, I am sending this directly to you and putting the form in your box.  Please fill out the form and advise patient to take any future forms to their PCP office.  Please call the patient when ready at [phone].   Chari Manning

## 2018-09-03 NOTE — Telephone Encounter (Signed)
Patient aware that forms are available for pick up at front desk. Copy made for batch scanning.   Aundra Espin, RN ([facility] RN)    

## 2018-09-03 NOTE — Telephone Encounter (Signed)
Done

## 2018-09-08 ENCOUNTER — Ambulatory Visit (INDEPENDENT_AMBULATORY_CARE_PROVIDER_SITE_OTHER): Payer: Medicare Other | Admitting: *Deleted

## 2018-09-08 DIAGNOSIS — I639 Cerebral infarction, unspecified: Secondary | ICD-10-CM | POA: Diagnosis not present

## 2018-09-09 NOTE — Progress Notes (Signed)
[facility] Summary Report / Loop Recorder 

## 2018-09-30 LAB — CUP PACEART REMOTE DEVICE CHECK
Date Time Interrogation Session: 20191029223952
Implantable Pulse Generator Implant Date: 20190722

## 2018-10-12 ENCOUNTER — Ambulatory Visit (INDEPENDENT_AMBULATORY_CARE_PROVIDER_SITE_OTHER): Payer: Medicare Other

## 2018-10-12 DIAGNOSIS — I639 Cerebral infarction, unspecified: Secondary | ICD-10-CM | POA: Diagnosis not present

## 2018-10-12 NOTE — Progress Notes (Signed)
[facility] Summary Report / Loop Recorder 

## 2018-11-08 LAB — CUP PACEART REMOTE DEVICE CHECK
Implantable Pulse Generator Implant Date: 20190722
MDC IDC SESS DTM: 20191201224006

## 2018-11-13 ENCOUNTER — Ambulatory Visit (INDEPENDENT_AMBULATORY_CARE_PROVIDER_SITE_OTHER): Payer: Medicare Other

## 2018-11-13 DIAGNOSIS — I639 Cerebral infarction, unspecified: Secondary | ICD-10-CM | POA: Diagnosis not present

## 2018-11-13 LAB — CUP PACEART REMOTE DEVICE CHECK
Implantable Pulse Generator Implant Date: 20190722
MDC IDC SESS DTM: 20200103205752

## 2018-11-16 NOTE — Progress Notes (Signed)
[facility] Summary Report / Loop Recorder 

## 2018-12-16 ENCOUNTER — Ambulatory Visit (INDEPENDENT_AMBULATORY_CARE_PROVIDER_SITE_OTHER): Payer: Medicare Other

## 2018-12-16 DIAGNOSIS — I639 Cerebral infarction, unspecified: Secondary | ICD-10-CM

## 2018-12-18 LAB — CUP PACEART REMOTE DEVICE CHECK
Implantable Pulse Generator Implant Date: 20190722
MDC IDC SESS DTM: 20200205230811

## 2018-12-25 NOTE — Progress Notes (Signed)
[facility] Summary Report / Loop Recorder 

## 2019-01-15 IMAGING — CT CT ANGIO NECK
1 of 8 series · 6 of 33 positions shown · IV contrast (APPLIED)
Comparison: 05/28/2018 MRI head.

CLINICAL DATA: 82 y/o  M; stroke for follow-up.

EXAM:
CT ANGIOGRAPHY HEAD AND NECK
TECHNIQUE: Multidetector CT imaging of the head and neck was performed using
the standard protocol during bolus administration of intravenous
contrast. Multiplanar CT image reconstructions and MIPs were
obtained to evaluate the vascular anatomy. Carotid stenosis
measurements (when applicable) are obtained utilizing NASCET
criteria, using the distal internal carotid diameter as the
denominator.
CONTRAST:  50 cc Isovue 370

[Series 7: ax thins · axial · 0.46mm/px · z∈[-234,+26]mm · 6 of 366 slices shown]
[im 53/366  soft-tissue]
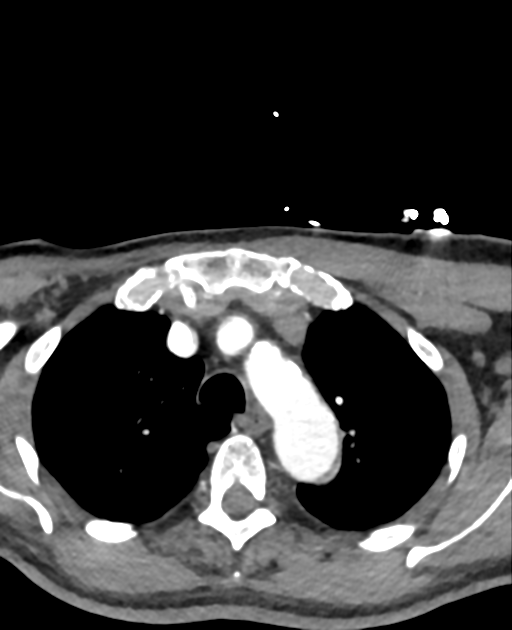
[im 105/366  bone]
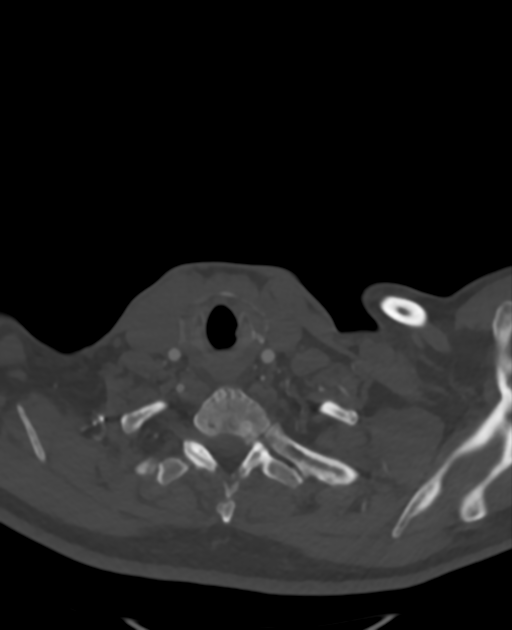
[im 157/366  soft-tissue]
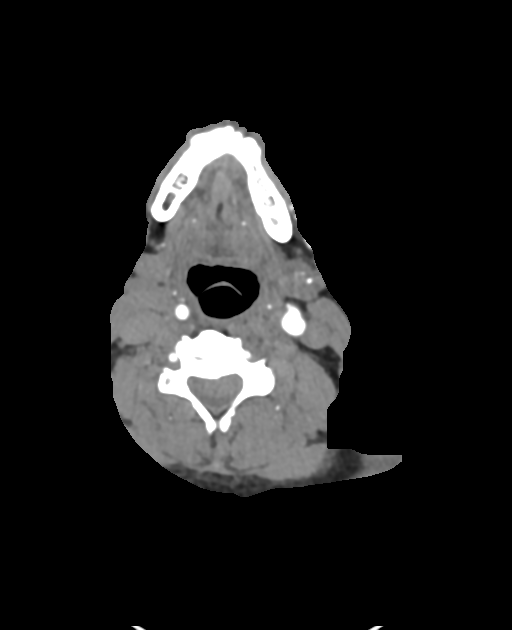
[im 209/366  bone]
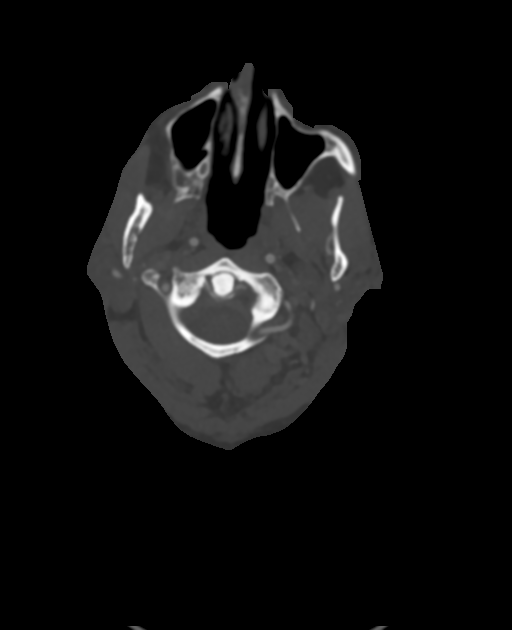
[im 261/366  soft-tissue]
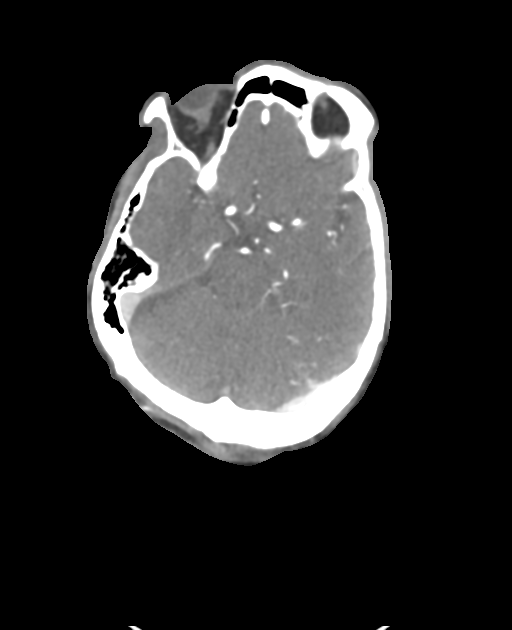
[im 313/366  bone]
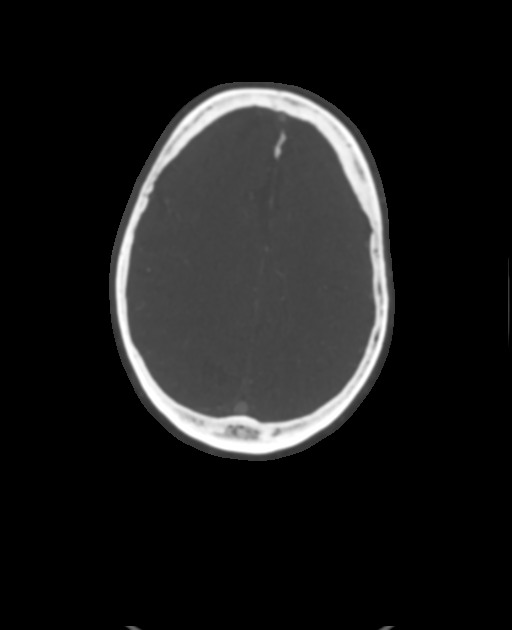

[6 of 33 positions shown; findings below may reference images not displayed]

FINDINGS: CTA NECK FINDINGS

Aortic arch: Bovine variant branching. Imaged portion shows no
evidence of aneurysm or dissection. No significant stenosis of the
major arch vessel origins. Mild calcific atherosclerosis.

Right carotid system: No evidence of dissection, stenosis (50% or
greater) or occlusion. Non stenotic beaded irregularity of the right
mid ICA.

Left carotid system: No evidence of dissection, stenosis (50% or
greater) or occlusion.

Vertebral arteries: Right dominant. No evidence of dissection,
stenosis (50% or greater) or occlusion.

Skeleton: Mild cervical spondylosis with multilevel disc and facet
degenerative changes greatest at the C4-C6 levels. No high-grade
bony canal stenosis.

Other neck: Negative.

Upper chest: Negative.

Review of the MIP images confirms the above findings

CTA HEAD FINDINGS

Anterior circulation: No significant stenosis, proximal occlusion,
aneurysm, or vascular malformation.

Posterior circulation: No significant stenosis, proximal occlusion,
aneurysm, or vascular malformation.

Venous sinuses: As permitted by contrast timing, patent.

Anatomic variants: Complete circle-of-Willis.

Delayed phase: No abnormal intracranial enhancement.

Review of the MIP images confirms the above findings
IMPRESSION: 1. Patent carotid and vertebral arteries. No dissection, aneurysm,
or hemodynamically significant stenosis utilizing NASCET criteria.
2. Patent anterior and posterior intracranial circulation. No large
vessel occlusion, aneurysm, or significant stenosis.
3. Non stenotic beaded irregularity of right mid cervical ICA may be
due to atherosclerotic changes or fibromuscular dysplasia.

By: Gretha Ferdinandus M.D.

## 2019-01-18 ENCOUNTER — Ambulatory Visit (INDEPENDENT_AMBULATORY_CARE_PROVIDER_SITE_OTHER): Payer: Medicare Other | Admitting: *Deleted

## 2019-01-18 DIAGNOSIS — I639 Cerebral infarction, unspecified: Secondary | ICD-10-CM | POA: Diagnosis not present

## 2019-01-19 LAB — CUP PACEART REMOTE DEVICE CHECK
Implantable Pulse Generator Implant Date: 20190722
MDC IDC SESS DTM: 20200309233955

## 2019-01-25 NOTE — Progress Notes (Signed)
[facility] Summary Report / Loop Recorder 

## 2019-02-22 ENCOUNTER — Ambulatory Visit (INDEPENDENT_AMBULATORY_CARE_PROVIDER_SITE_OTHER): Payer: Medicare Other | Admitting: *Deleted

## 2019-02-22 ENCOUNTER — Other Ambulatory Visit: Payer: Self-pay

## 2019-02-22 DIAGNOSIS — I639 Cerebral infarction, unspecified: Secondary | ICD-10-CM | POA: Diagnosis not present

## 2019-02-22 LAB — CUP PACEART REMOTE DEVICE CHECK
Date Time Interrogation Session: 20200412000733
Implantable Pulse Generator Implant Date: 20190722

## 2019-03-02 NOTE — Progress Notes (Signed)
[facility] Summary Report / Loop Recorder 

## 2019-03-26 ENCOUNTER — Ambulatory Visit (INDEPENDENT_AMBULATORY_CARE_PROVIDER_SITE_OTHER): Payer: Medicare Other | Admitting: *Deleted

## 2019-03-26 ENCOUNTER — Other Ambulatory Visit: Payer: Self-pay

## 2019-03-26 DIAGNOSIS — I639 Cerebral infarction, unspecified: Secondary | ICD-10-CM

## 2019-03-26 LAB — CUP PACEART REMOTE DEVICE CHECK
Date Time Interrogation Session: 20200515014039
Implantable Pulse Generator Implant Date: 20190722

## 2019-03-29 NOTE — Progress Notes (Signed)
[facility] Summary Report / Loop Recorder 

## 2019-04-06 ENCOUNTER — Telehealth: Payer: Self-pay

## 2019-04-06 NOTE — Telephone Encounter (Signed)
-----   Message from Duke Salvia, MD sent at 04/05/2019  9:46 AM EDT ----- Normal device remote transmission Atrial fibrillation detected --   2 min long Please arrange telehealth visit  To discuss

## 2019-04-06 NOTE — Telephone Encounter (Signed)
LVM with pt to arrange a telehealth visit with Dr Graciela Husbands to discuss linq report.

## 2019-04-07 ENCOUNTER — Other Ambulatory Visit: Payer: Self-pay

## 2019-04-07 ENCOUNTER — Encounter: Payer: Self-pay | Admitting: Internal Medicine

## 2019-04-07 ENCOUNTER — Telehealth (INDEPENDENT_AMBULATORY_CARE_PROVIDER_SITE_OTHER): Payer: Medicare Other | Admitting: Internal Medicine

## 2019-04-07 VITALS — Ht 65.0 in | Wt 150.0 lb

## 2019-04-07 DIAGNOSIS — I639 Cerebral infarction, unspecified: Secondary | ICD-10-CM | POA: Diagnosis not present

## 2019-04-07 DIAGNOSIS — Z4509 Encounter for adjustment and management of other cardiac device: Secondary | ICD-10-CM

## 2019-04-07 NOTE — Patient Instructions (Addendum)
Called and discussed the following recommendations. He verbalized understanding and had no additional questions.   Medication Instructions:  Your physician has recommended you make the following change in your medication:   1. Stop Aspirin 2. Please discuss your [facility]in [facility] with [facility]  Labwork: None ordered.   Testing/Procedures: None ordered.   Follow-Up: Your physician recommends that you schedule a follow-up appointment as needed with Dr Graciela Husbands  Any Other Special Instructions Will Be Listed Below (If Applicable).     If you need a refill on your cardiac medications before your next appointment, please call your pharmacy.

## 2019-04-07 NOTE — Progress Notes (Signed)
Electrophysiology TeleHealth Note   Due to national recommendations of social distancing due to COVID 19, an audio/video telehealth visit is felt to be most appropriate for this patient at this time.  See MyChart message from today for the patient's consent to telehealth for [facility].   Date:  04/07/2019   ID:  Jonathan Daniels., DOB Mar 13, 1936, MRN [id number]  Location: patient's home  Provider location: [address] [place]  Evaluation Performed: Follow-up visit  PCP:  [facility], [facility]  Cardiologist:    Electrophysiologist:  SK   Chief Complaint: Cryptogenic Stroke *  History of Present Illness:    Jonathan Daniels. is a 83 y.o. male who presents via audio/video conferencing for a telehealth visit today. The patient did not have access to video technology/had technical difficulties with video requiring transitioning to audio format only (telephone).  All issues noted in this document were discussed and addressed.  No physical exam could be performed with this format.    Since last being seen by our service in the hospital 7/19 for cryptogenic stroke implantation of a loop recorder, the patient reports doing well  The patient denies chest pain, shortness of breath, nocturnal dyspnea, orthopnea or peripheral edema.  There have been no palpitations, lightheadedness or syncope.    Fully recovered from the stroke   7/19 echocardiogram normal LV function  The patient denies symptoms of fevers, chills, cough, or new SOB worrisome for COVID 19. *   Past Medical History:  Diagnosis Date  . Arthritis    "right knee" (05/28/2018)  . BPH (benign prostatic hyperplasia)    but not on any meds  . CVA (cerebral vascular accident) (HCC) 05/28/2018   "left mouth droopy" (05/28/2018)  . GERD (gastroesophageal reflux disease)   . Glaucoma    uses eye drops daily  . Hyperlipidemia    was on med but has been off for a yr-medical md is aware  . Hypertension    was on  med but has been off a yr-medical md is aware  . Joint swelling   . Primary osteoarthritis of left hip 05/27/2013  . Type II diabetes mellitus (HCC)    only takes Cinnamon  . Urinary frequency     Past Surgical History:  Procedure Laterality Date  . CARDIAC CATHETERIZATION  2011  . COLONOSCOPY    . JOINT REPLACEMENT    . LOOP RECORDER INSERTION N/A 06/01/2018   Procedure: LOOP RECORDER INSERTION;  Surgeon: Duke Salvia, MD;  Location: [facility] INVASIVE CV LAB;  Service: Cardiovascular;  Laterality: N/A;  . PATELLAR TENDON REPAIR Bilateral   . SHOULDER OPEN ROTATOR CUFF REPAIR Right   . TEE WITHOUT CARDIOVERSION N/A 06/01/2018   Procedure: TRANSESOPHAGEAL ECHOCARDIOGRAM (TEE);  Surgeon: Chilton Si, MD;  Location: [facility] ENDOSCOPY;  Service: Cardiovascular;  Laterality: N/A;  . TOTAL HIP ARTHROPLASTY Left 02/08/2014   Procedure: TOTAL HIP ARTHROPLASTY;  Surgeon: Eulas Post, MD;  Location: [facility] OR;  Service: Orthopedics;  Laterality: Left;    Current Outpatient Medications  Medication Sig Dispense Refill  . aspirin EC 81 MG EC tablet Take 1 tablet (81 mg total) by mouth daily. 90 tablet 0  . bimatoprost (LUMIGAN) 0.03 % ophthalmic solution Place 1 drop into both eyes at bedtime.    . brimonidine-timolol (COMBIGAN) 0.2-0.5 % ophthalmic solution Place 1 drop into both eyes every 12 (twelve) hours.    . clopidogrel (PLAVIX) 75 MG tablet Take 1 tablet (75 mg total) by  mouth daily. 30 tablet 0  . lisinopril (ZESTRIL) 5 MG tablet Take 5 mg by mouth daily.    . Multiple Vitamin (MULTIVITAMIN WITH MINERALS) TABS tablet Take 1 tablet by mouth daily. 30 tablet 0   No current facility-administered medications for this visit.     Allergies:   Patient has no known allergies.   Social History:  The patient  reports that he has never smoked. He has never used smokeless tobacco. He reports current alcohol use. He reports that he does not use drugs.   Family History:  The patient's   family history  includes Diabetes in his father; Kidney disease in an other family member.   ROS:  Please see the history of present illness.   All other systems are personally reviewed and negative.    Exam:    Vital Signs:  Ht 5\' 5"  (1.651 m)   Wt 150 lb (68 kg)   BMI 24.96 kg/m        Labs/Other Tests and Data Reviewed:    Recent Labs: 05/28/2018: ALT 20 05/29/2018: TSH 4.873 05/31/2018: BUN 21; Creatinine, Ser 1.23; Hemoglobin 11.7; Platelets 179; Potassium 3.7; Sodium 141   Wt Readings from Last 3 Encounters:  04/07/19 150 lb (68 kg)  05/28/18 144 lb 2.9 oz (65.4 kg)  02/10/14 153 lb (69.4 kg)     Other studies personally reviewed: Additional studies/ records that were reviewed today include:    Last device remote is reviewed from [employer] PDF dated 5/20 which reveals normal device function,   arrhythmias - none     ASSESSMENT & PLAN:    Cryptogenic Stroke   Implantable loop recorder LINQ   Reviewed Dr. Marlis EdelsonSethi's notes recommendation was for DAPT for 3 months so we will stop the aspirin  Based on the SPARCL trial statins should be atorvastatin 40.  Have asked [facility] to [facility] with the [facility].  Tolerating DAPT.     COVID 19 screen The patient denies symptoms of COVID 19 at this time.  The importance of social distancing was discussed today.  Follow-up:  Prn  Next remote: As Scheduled   Current medicines are reviewed at length with the patient today.   The patient does not have concerns regarding his medicines.  The following changes were made today:   Stop ASA  Continue statin but will [facility] with [facility] re changing to atorvastatin 40   Labs/ tests ordered today include:   No orders of the defined types were placed in this encounter.      Patient Risk:  after full review of this patients clinical status, I feel that they are at moderate risk at this time.  Today, I have spent 10 * minutes with the patient with telehealth technology discussing the above.  Signed,  Sherryl MangesSteven Klein, MD  04/07/2019 10:23 AM     [facility] [address] [place] [address] [phone] (office) [phone] (fax)

## 2019-04-15 ENCOUNTER — Other Ambulatory Visit: Payer: Self-pay

## 2019-04-15 ENCOUNTER — Telehealth: Payer: Medicare Other | Admitting: Internal Medicine

## 2019-04-28 ENCOUNTER — Ambulatory Visit (INDEPENDENT_AMBULATORY_CARE_PROVIDER_SITE_OTHER): Payer: Medicare Other | Admitting: *Deleted

## 2019-04-28 DIAGNOSIS — I639 Cerebral infarction, unspecified: Secondary | ICD-10-CM | POA: Diagnosis not present

## 2019-04-28 LAB — CUP PACEART REMOTE DEVICE CHECK
Date Time Interrogation Session: 20200617020737
Implantable Pulse Generator Implant Date: 20190722

## 2019-05-05 ENCOUNTER — Other Ambulatory Visit: Payer: Self-pay | Admitting: *Deleted

## 2019-05-05 DIAGNOSIS — Z20822 Contact with and (suspected) exposure to covid-19: Secondary | ICD-10-CM

## 2019-05-09 LAB — NOVEL CORONAVIRUS, NAA: SARS-CoV-2, NAA: NOT DETECTED

## 2019-05-10 NOTE — Progress Notes (Signed)
[facility] Summary Report / Loop Recorder 

## 2019-05-17 NOTE — Patient Instructions (Addendum)
YOU NEED TO HAVE A COVID 19 TEST ON_______ @_______ , THIS TEST MUST BE DONE BEFORE SURGERY, COME TO [facility]. ONCE YOUR COVID TEST IS COMPLETED, PLEASE BEGIN THE QUARANTINE INSTRUCTIONS AS OUTLINED IN YOUR HANDOUT.                Jonathan FearingJames N Sobieski Jr.   Your procedure is scheduled on: 05-25-2019  Report to [facility] Main  Entrance              Report to SHORT STAY  At 530 AM      Call this number if you have problems the morning of surgery [phone]    Remember:. BRUSH YOUR TEETH MORNING OF SURGERY AND RINSE YOUR MOUTH OUT, NO CHEWING GUM CANDY OR MINTS.   NO SOLID FOOD AFTER MIDNIGHT THE NIGHT PRIOR TO SURGERY. NOTHING BY MOUTH EXCEPT CLEAR LIQUIDS UNTIL 430 AM. PLEASE FINISH G2   DRINK PER SURGEON ORDER 3 HOURS PRIOR TO SCHEDULED SURGERY TIME WHICH NEEDS TO BE COMPLETED AT 430 AM.    CLEAR LIQUID DIET   Foods Allowed                                                                     Foods Excluded  Coffee and tea, regular and decaf                             liquids that you cannot  Plain Jell-O in any flavor                                             see through such as: Fruit ices (not with fruit pulp)                                     milk, soups, orange juice  Iced Popsicles                                    All solid food Carbonated beverages, regular and diet                                    Cranberry, grape and apple juices Sports drinks like Gatorade Lightly seasoned clear broth or consume(fat free) Sugar, honey syrup  Sample Menu Breakfast                                Lunch                                     Supper Cranberry juice                    Beef broth  Chicken broth Jell-O                                     Grape juice                           Apple juice Coffee or tea                        Jell-O                                      Popsicle                                                 Coffee or tea                        Coffee or tea  _____________________________________________________________________    Take these medicines the morning of surgery with A SIP OF WATER: eye drops   DO NOT TAKE ANY DIABETIC MEDICATIONS DAY OF YOUR SURGERY          How to Manage Your Diabetes Before and After Surgery  Why is it important to control my blood sugar before and after surgery? . Improving blood sugar levels before and after surgery helps healing and can limit problems. . A way of improving blood sugar control is eating a healthy diet by: o  Eating less sugar and carbohydrates o  Increasing activity/exercise o  Talking with your doctor about reaching your blood sugar goals . High blood sugars (greater than 180 mg/dL) can raise your risk of infections and slow your recovery, so you will need to focus on controlling your diabetes during the weeks before surgery. . Make sure that the doctor who takes care of your diabetes knows about your planned surgery including the date and location.  How do I manage my blood sugar before surgery? . Check your blood sugar at least 4 times a day, starting 2 days before surgery, to make sure that the level is not too high or low. o Check your blood sugar the morning of your surgery when you wake up and every 2 hours until you get to the Short Stay unit. . If your blood sugar is less than 70 mg/dL, you will need to treat for low blood sugar: o Do not take insulin. o Treat a low blood sugar (less than 70 mg/dL) with  cup of clear juice (cranberry or apple), 4 glucose tablets, OR glucose gel. o Recheck blood sugar in 15 minutes after treatment (to make sure it is greater than 70 mg/dL). If your blood sugar is not greater than 70 mg/dL on recheck, call [phone] for further instructions. . Report your blood sugar to the short stay nurse when you get to Short Stay.  . If you are admitted to the hospital after  surgery: o Your blood sugar will be checked by the staff and you will probably be given insulin after surgery (instead of oral diabetes medicines) to make sure you have good blood sugar levels. o The goal for blood sugar control after surgery is  80-180 mg/dL.   WHAT DO I DO ABOUT MY DIABETES MEDICATION?      Reviewed and Endorsed by [facility] Patient Education Committee, August 2015                     You may not have any metal on your body including hair pins and              piercings  Do not wear jewelry, lotions, powders or perfumes, deodorant                           Men may shave face and neck.   Do not bring valuables to the hospital. [facility] IS NOT             RESPONSIBLE   FOR VALUABLES.  Contacts, dentures or bridgework may not be worn into surgery.      _____________________________________________________________________            [facility] - Preparing for Surgery Before surgery, you can play an important role.  Because skin is not sterile, your skin needs to be as free of germs as possible.  You can reduce the number of germs on your skin by washing with CHG (chlorahexidine gluconate) soap before surgery.  CHG is an antiseptic cleaner which kills germs and bonds with the skin to continue killing germs even after washing. Please DO NOT use if you have an allergy to CHG or antibacterial soaps.  If your skin becomes reddened/irritated stop using the CHG and inform your nurse when you arrive at Short Stay. Do not shave (including legs and underarms) for at least 48 hours prior to the first CHG shower.  You may shave your face/neck. Please follow these instructions carefully:  1.  Shower with CHG Soap the night before surgery and the  morning of Surgery.  2.  If you choose to wash your hair, wash your hair first as usual with your  normal  shampoo.  3.  After you shampoo, rinse your hair and body thoroughly to remove the  shampoo.                           4.  Use  CHG as you would any other liquid soap.  You can apply chg directly  to the skin and wash                       Gently with a scrungie or clean washcloth.  5.  Apply the CHG Soap to your body ONLY FROM THE NECK DOWN.   Do not use on face/ open                           Wound or open sores. Avoid contact with eyes, ears mouth and genitals (private parts).                       Wash face,  Genitals (private parts) with your normal soap.             6.  Wash thoroughly, paying special attention to the area where your surgery  will be performed.  7.  Thoroughly rinse your body with warm water from the neck down.  8.  DO NOT shower/wash with your normal soap after using and rinsing off  the  CHG Soap.                9.  Pat yourself dry with a clean towel.            10.  Wear clean pajamas.            11.  Place clean sheets on your bed the night of your first shower and do not  sleep with pets. Day of Surgery : Do not apply any lotions/deodorants the morning of surgery.  Please wear clean clothes to the hospital/surgery center.  FAILURE TO FOLLOW THESE INSTRUCTIONS MAY RESULT IN THE CANCELLATION OF YOUR SURGERY PATIENT SIGNATURE_________________________________  NURSE SIGNATURE__________________________________  ________________________________________________________________________   Adam Phenix  An incentive spirometer is a tool that can help keep your lungs clear and active. This tool measures how well you are filling your lungs with each breath. Taking long deep breaths may help reverse or decrease the chance of developing breathing (pulmonary) problems (especially infection) following:  A long period of time when you are unable to move or be active. BEFORE THE PROCEDURE   If the spirometer includes an indicator to show your best effort, your nurse or respiratory therapist will set it to a desired goal.  If possible, sit up straight or lean slightly forward. Try not to  slouch.  Hold the incentive spirometer in an upright position. INSTRUCTIONS FOR USE  1. Sit on the edge of your bed if possible, or sit up as far as you can in bed or on a chair. 2. Hold the incentive spirometer in an upright position. 3. Breathe out normally. 4. Place the mouthpiece in your mouth and seal your lips tightly around it. 5. Breathe in slowly and as deeply as possible, raising the piston or the ball toward the top of the column. 6. Hold your breath for 3-5 seconds or for as long as possible. Allow the piston or ball to fall to the bottom of the column. 7. Remove the mouthpiece from your mouth and breathe out normally. 8. Rest for a few seconds and repeat Steps 1 through 7 at least 10 times every 1-2 hours when you are awake. Take your time and take a few normal breaths between deep breaths. 9. The spirometer may include an indicator to show your best effort. Use the indicator as a goal to work toward during each repetition. 10. After each set of 10 deep breaths, practice coughing to be sure your lungs are clear. If you have an incision (the cut made at the time of surgery), support your incision when coughing by placing a pillow or rolled up towels firmly against it. Once you are able to get out of bed, walk around indoors and cough well. You may stop using the incentive spirometer when instructed by your caregiver.  RISKS AND COMPLICATIONS  Take your time so you do not get dizzy or light-headed.  If you are in pain, you may need to take or ask for pain medication before doing incentive spirometry. It is harder to take a deep breath if you are having pain. AFTER USE  Rest and breathe slowly and easily.  It can be helpful to keep track of a log of your progress. Your caregiver can provide you with a simple table to help with this. If you are using the spirometer at home, follow these instructions: [place] IF:   You are having difficultly using the spirometer.  You  have trouble using the spirometer as often  as instructed.  Your pain medication is not giving enough relief while using the spirometer.  You develop fever of 100.5 F (38.1 C) or higher. SEEK IMMEDIATE MEDICAL CARE IF:   You cough up bloody sputum that had not been present before.  You develop fever of 102 F (38.9 C) or greater.  You develop worsening pain at or near the incision site. MAKE SURE YOU:   Understand these instructions.  Will watch your condition.  Will get help right away if you are not doing well or get worse. Document Released: 03/10/2007 Document Revised: 01/20/2012 Document Reviewed: 05/11/2007 [facility] Patient Information 2014 [place], [place].   ________________________________________________________________________

## 2019-05-17 NOTE — Progress Notes (Signed)
Cardiac clearance dr Caryl Comes 04-07-19 epic ekg 7-18- 19 epic Ct angio neck 05-30-19 epic Last loop recordesr check 04-28-19

## 2019-05-18 ENCOUNTER — Encounter (HOSPITAL_COMMUNITY): Admission: RE | Admit: 2019-05-18 | Payer: Medicare Other | Source: Ambulatory Visit

## 2019-05-18 ENCOUNTER — Encounter (HOSPITAL_COMMUNITY)
Admission: RE | Admit: 2019-05-18 | Discharge: 2019-05-18 | Disposition: A | Discharge status: Home / Self Care | Payer: Medicare Other | Source: Ambulatory Visit | Attending: Orthopedic Surgery | Admitting: Orthopedic Surgery

## 2019-05-18 ENCOUNTER — Other Ambulatory Visit: Payer: Self-pay

## 2019-05-18 ENCOUNTER — Encounter (HOSPITAL_COMMUNITY): Payer: Self-pay

## 2019-05-18 DIAGNOSIS — Z8673 Personal history of transient ischemic attack (TIA), and cerebral infarction without residual deficits: Secondary | ICD-10-CM | POA: Diagnosis not present

## 2019-05-18 DIAGNOSIS — Z01818 Encounter for other preprocedural examination: Secondary | ICD-10-CM | POA: Insufficient documentation

## 2019-05-18 DIAGNOSIS — Z79899 Other long term (current) drug therapy: Secondary | ICD-10-CM | POA: Insufficient documentation

## 2019-05-18 DIAGNOSIS — E119 Type 2 diabetes mellitus without complications: Secondary | ICD-10-CM | POA: Insufficient documentation

## 2019-05-18 DIAGNOSIS — I491 Atrial premature depolarization: Secondary | ICD-10-CM | POA: Diagnosis not present

## 2019-05-18 DIAGNOSIS — E785 Hyperlipidemia, unspecified: Secondary | ICD-10-CM | POA: Insufficient documentation

## 2019-05-18 DIAGNOSIS — M1711 Unilateral primary osteoarthritis, right knee: Secondary | ICD-10-CM | POA: Diagnosis not present

## 2019-05-18 DIAGNOSIS — Z96642 Presence of left artificial hip joint: Secondary | ICD-10-CM | POA: Insufficient documentation

## 2019-05-18 DIAGNOSIS — Z7982 Long term (current) use of aspirin: Secondary | ICD-10-CM | POA: Insufficient documentation

## 2019-05-18 DIAGNOSIS — I10 Essential (primary) hypertension: Secondary | ICD-10-CM | POA: Insufficient documentation

## 2019-05-18 DIAGNOSIS — Z7902 Long term (current) use of antithrombotics/antiplatelets: Secondary | ICD-10-CM | POA: Insufficient documentation

## 2019-05-18 DIAGNOSIS — N400 Enlarged prostate without lower urinary tract symptoms: Secondary | ICD-10-CM | POA: Insufficient documentation

## 2019-05-18 DIAGNOSIS — H409 Unspecified glaucoma: Secondary | ICD-10-CM | POA: Diagnosis not present

## 2019-05-18 DIAGNOSIS — K219 Gastro-esophageal reflux disease without esophagitis: Secondary | ICD-10-CM | POA: Insufficient documentation

## 2019-05-18 DIAGNOSIS — Z1159 Encounter for screening for other viral diseases: Secondary | ICD-10-CM | POA: Diagnosis not present

## 2019-05-18 HISTORY — DX: Other specified postprocedural states: Z98.890

## 2019-05-18 LAB — CBC
HCT: 37.9 % — ABNORMAL LOW (ref 39.0–52.0)
Hemoglobin: 12.5 g/dL — ABNORMAL LOW (ref 13.0–17.0)
MCH: 29.3 pg (ref 26.0–34.0)
MCHC: 33 g/dL (ref 30.0–36.0)
MCV: 88.8 fL (ref 80.0–100.0)
Platelets: 217 10*3/uL (ref 150–400)
RBC: 4.27 MIL/uL (ref 4.22–5.81)
RDW: 14.8 % (ref 11.5–15.5)
WBC: 7.7 10*3/uL (ref 4.0–10.5)
nRBC: 0 % (ref 0.0–0.2)

## 2019-05-18 LAB — SURGICAL PCR SCREEN
MRSA, PCR: NEGATIVE
Staphylococcus aureus: NEGATIVE

## 2019-05-18 LAB — BASIC METABOLIC PANEL WITH GFR
Anion gap: 7 (ref 5–15)
BUN: 24 mg/dL — ABNORMAL HIGH (ref 8–23)
CO2: 24 mmol/L (ref 22–32)
Calcium: 9.6 mg/dL (ref 8.9–10.3)
Chloride: 109 mmol/L (ref 98–111)
Creatinine, Ser: 1.32 mg/dL — ABNORMAL HIGH (ref 0.61–1.24)
GFR calc Af Amer: 58 mL/min — ABNORMAL LOW (ref >60)
GFR calc non Af Amer: 50 mL/min — ABNORMAL LOW (ref >60)
Glucose, Bld: 112 mg/dL — ABNORMAL HIGH (ref 70–99)
Potassium: 4.3 mmol/L (ref 3.5–5.1)
Sodium: 140 mmol/L (ref 135–145)

## 2019-05-18 LAB — HEMOGLOBIN A1C
Hgb A1c MFr Bld: 6 % — ABNORMAL HIGH (ref 4.8–5.6)
Mean Plasma Glucose: 125.5 mg/dL

## 2019-05-18 LAB — GLUCOSE, CAPILLARY: Glucose-Capillary: 117 mg/dL — ABNORMAL HIGH (ref 70–99)

## 2019-05-18 NOTE — Progress Notes (Signed)
Clearance dr. Dolores Lory and Dr. Sharlynn Oliphant on chart

## 2019-05-19 ENCOUNTER — Telehealth: Payer: Self-pay

## 2019-05-19 NOTE — Telephone Encounter (Signed)
Pt monitor gave him the error code [id number]. I gave the pt wife the number to [facility] tech support. She agreed to give them a call to troubleshoot that monitor.

## 2019-05-19 NOTE — Telephone Encounter (Signed)
I spoke with the pt wife and she agreed to have the pt send a transmission. I gave her my direct office number for her to call me back.

## 2019-05-20 ENCOUNTER — Other Ambulatory Visit: Payer: Self-pay | Admitting: Orthopedic Surgery

## 2019-05-20 NOTE — Anesthesia Preprocedure Evaluation (Addendum)
Anesthesia Evaluation  Patient identified by MRN, date of birth, ID band Patient awake    Reviewed: Allergy & Precautions, NPO status , Patient's Chart, lab work & pertinent test results  Airway Mallampati: II  TM Distance: >3 FB Neck ROM: Full    Dental no notable dental hx.    Pulmonary neg pulmonary ROS,    Pulmonary exam normal breath sounds clear to auscultation       Cardiovascular hypertension, Pt. on medications negative cardio ROS Normal cardiovascular exam Rhythm:Regular Rate:Normal     Neuro/Psych Dementia CVA negative psych ROS   GI/Hepatic Neg liver ROS, GERD  ,  Endo/Other  negative endocrine ROSdiabetes, Type 2  Renal/GU negative Renal ROS  negative genitourinary   Musculoskeletal  (+) Arthritis , Osteoarthritis,    Abdominal   Peds negative pediatric ROS (+)  Hematology negative hematology ROS (+)   Anesthesia Other Findings   Reproductive/Obstetrics negative OB ROS                            Anesthesia Physical Anesthesia Plan  ASA: III  Anesthesia Plan: General   Post-op Pain Management:  Regional for Post-op pain   Induction: Intravenous  PONV Risk Score and Plan: 2 and Ondansetron, Midazolam and Treatment may vary due to age or medical condition  Airway Management Planned: LMA  Additional Equipment:   Intra-op Plan:   Post-operative Plan: Extubation in OR  Informed Consent: I have reviewed the patients History and Physical, chart, labs and discussed the procedure including the risks, benefits and alternatives for the proposed anesthesia with the patient or authorized representative who has indicated his/her understanding and acceptance.     Dental advisory given  Plan Discussed with: CRNA  Anesthesia Plan Comments: (See PAT note 05/18/2019, Konrad Felix, PA-C)       Anesthesia Quick Evaluation

## 2019-05-20 NOTE — Care Plan (Signed)
Spoke with patient and wife prior to surgery. He plans to discharge to home with her to assist. HHPT referral to [facility]. He has all needed equipment at home. Patient and MD aware of plan and agreeable. Choice offered.    Ladell Heads, [place]

## 2019-05-20 NOTE — Progress Notes (Signed)
Anesthesia Chart Review   Case: [id number] Date/Time: 05/25/19 0715   Procedure: TOTAL KNEE ARTHROPLASTY (Right )   Anesthesia type: Choice   Pre-op diagnosis: djd right knee   Location: [facility]   Surgeon: Marchia Bond, MD      DISCUSSION:83 y.o. never smoker with h/o GERD, HLD, cryptogenic stroke 05/28/18 loop recorder in place with last check 04/28/2019 with no new symptom episodes, tachy episodes, brady, or pause episodes. No new AF episodes (on Plavix and ASA), HTN, DM II, BPH, right knee DJD scheduled for above procedure 05/25/2019 with Dr. Marchia Bond.   Clearance received from Dr. Dwyane Dee (on chart) 04/01/2019 which states "Has had a stroke July 2019 so has a risk when not on antiplatelet therapy.  Recommend that is surgery is needed to stop antiplatelet for minimum number of days as needed and to resume them as soon as possible.  Other clearance for surgery to be obtained by PCP."  Dr. Sharlynn Oliphant ok to stop Plavix and ASA for 4 days prior to surgery.    VS: BP 136/68 (BP Location: Right Arm)   Pulse 62   Temp 36.8 C (Oral)   Resp 18   Ht 5\' 5"  (1.651 m)   Wt 70.5 kg   SpO2 100%   BMI 25.87 kg/m   PROVIDERS: Clinic, Zenovia Jarred, MD is Electrophysiologist last seen 04/07/2019  Casandra Doffing, MD is Neurologist  LABS: Labs reviewed: Acceptable for surgery. (all labs ordered are listed, but only abnormal results are displayed)  Labs Reviewed  BASIC METABOLIC PANEL - Abnormal; Notable for the following components:      Result Value   Glucose, Bld 112 (*)    BUN 24 (*)    Creatinine, Ser 1.32 (*)    GFR calc non Af Amer 50 (*)    GFR calc Af Amer 58 (*)    All other components within normal limits  CBC - Abnormal; Notable for the following components:   Hemoglobin 12.5 (*)    HCT 37.9 (*)    All other components within normal limits  HEMOGLOBIN A1C - Abnormal; Notable for the following components:   Hgb A1c MFr Bld 6.0 (*)    All other  components within normal limits  GLUCOSE, CAPILLARY - Abnormal; Notable for the following components:   Glucose-Capillary 117 (*)    All other components within normal limits  SURGICAL PCR SCREEN     IMAGES:   EKG: 05/18/2019 Rate 68 bpm Sinus rhythm with Premature atrial complexes Nonspecific T wave abnormality Otherwise normal ECG  CV: Echo 06/01/18 Study Conclusions  - Left ventricle: Systolic function was normal. The estimated   ejection fraction was in the range of 60% to 65%. Wall motion was   normal; there were no regional wall motion abnormalities. - Aortic valve: There was trivial regurgitation. - Aorta: There was mild atheromatous plaque in the descending aorta   and moderate plaque in the aortic arch. - Mitral valve: There was mild regurgitation. - Left atrium: No evidence of thrombus in the atrial cavity or   appendage. No evidence of thrombus in the atrial cavity or   appendage. - Right ventricle: The cavity size was normal. Wall thickness was   normal. Systolic function was normal. - Right atrium: No evidence of thrombus in the atrial cavity or   appendage. - Atrial septum: There was a patent foramen ovale with right to   left flow noted on saline microcavitation  study. - Tricuspid valve: There was mild regurgitation. - Pulmonic valve: No evidence of vegetation. Past Medical History:  Diagnosis Date  . Arthritis    "right knee" (05/28/2018)  . BPH (benign prostatic hyperplasia)    but not on any meds  . CVA (cerebral vascular accident) (HCC) 05/28/2018   "left mouth droopy" (05/28/2018)   no longer present 05-18-19  . GERD (gastroesophageal reflux disease)   . Glaucoma    uses eye drops daily  . History of loop recorder    present at preop 05-18-19  . Hyperlipidemia    was on med but has been off for a yr-medical md is aware  . Hypertension    was on med but has been off a yr-medical md is aware  . Joint swelling   . Primary osteoarthritis of left hip  05/27/2013  . Type II diabetes mellitus (HCC)    only takes Cinnamon  . Urinary frequency     Past Surgical History:  Procedure Laterality Date  . CARDIAC CATHETERIZATION  2011  . COLONOSCOPY    . JOINT REPLACEMENT    . LOOP RECORDER INSERTION N/A 06/01/2018   Procedure: LOOP RECORDER INSERTION;  Surgeon: Duke SalviaKlein, Steven C, MD;  [facility];  Service: Cardiovascular;  Laterality: N/A;  . PATELLAR TENDON REPAIR Bilateral   . SHOULDER OPEN ROTATOR CUFF REPAIR Right   . TEE WITHOUT CARDIOVERSION N/A 06/01/2018   Procedure: TRANSESOPHAGEAL ECHOCARDIOGRAM (TEE);  Surgeon: Chilton Siandolph, Tiffany, MD;  [facility];  Service: Cardiovascular;  Laterality: N/A;  . TOTAL HIP ARTHROPLASTY Left 02/08/2014   Procedure: TOTAL HIP ARTHROPLASTY;  Surgeon: Eulas PostJoshua P Landau, MD;  Location: [facility] OR;  Service: Orthopedics;  Laterality: Left;    MEDICATIONS: . Cinnamon 500 MG capsule  . aspirin EC 81 MG tablet  . Brinzolamide-Brimonidine 1-0.2 % SUSP  . clopidogrel (PLAVIX) 75 MG tablet  . latanoprost (XALATAN) 0.005 % ophthalmic solution  . lisinopril (ZESTRIL) 5 MG tablet  . Multiple Vitamin (MULTIVITAMIN WITH MINERALS) TABS tablet  . pravastatin (PRAVACHOL) 10 MG tablet  . vitamin B-12 (CYANOCOBALAMIN) 1000 MCG tablet   No current facility-administered medications for this encounter.      Janey GentaJessica Willisha Sligar, PA-C [facility]Surgical Testing [phone] 05/20/19  11:30 AM

## 2019-05-21 ENCOUNTER — Other Ambulatory Visit (HOSPITAL_COMMUNITY)
Admission: RE | Admit: 2019-05-21 | Discharge: 2019-05-21 | Disposition: A | Discharge status: Home / Self Care | Payer: Medicare Other | Source: Ambulatory Visit | Attending: Orthopedic Surgery | Admitting: Orthopedic Surgery

## 2019-05-21 DIAGNOSIS — Z01818 Encounter for other preprocedural examination: Secondary | ICD-10-CM | POA: Diagnosis not present

## 2019-05-21 LAB — SARS CORONAVIRUS 2 (TAT 6-24 HRS): SARS Coronavirus 2: NEGATIVE

## 2019-05-21 NOTE — Telephone Encounter (Signed)
New monitor ordered 05/19/2019.

## 2019-05-25 ENCOUNTER — Other Ambulatory Visit: Payer: Self-pay

## 2019-05-25 ENCOUNTER — Encounter (HOSPITAL_COMMUNITY): Payer: Self-pay | Admitting: *Deleted

## 2019-05-25 ENCOUNTER — Inpatient Hospital Stay (HOSPITAL_COMMUNITY): Payer: Medicare Other | Admitting: Certified Registered"

## 2019-05-25 ENCOUNTER — Inpatient Hospital Stay (HOSPITAL_COMMUNITY): Payer: Medicare Other | Admitting: Physician Assistant

## 2019-05-25 ENCOUNTER — Inpatient Hospital Stay (HOSPITAL_COMMUNITY): Payer: Medicare Other

## 2019-05-25 ENCOUNTER — Inpatient Hospital Stay (HOSPITAL_COMMUNITY)
Admission: RE | Admit: 2019-05-25 | Discharge: 2019-05-26 | DRG: 470 | Disposition: A | Discharge status: Home Health | Payer: Medicare Other | Attending: Orthopedic Surgery | Admitting: Orthopedic Surgery

## 2019-05-25 ENCOUNTER — Encounter (HOSPITAL_COMMUNITY)
Admission: RE | Disposition: A | Discharge status: Home Health | Payer: Self-pay | Source: Home / Self Care | Attending: Orthopedic Surgery

## 2019-05-25 DIAGNOSIS — D649 Anemia, unspecified: Secondary | ICD-10-CM | POA: Diagnosis present

## 2019-05-25 DIAGNOSIS — Z96642 Presence of left artificial hip joint: Secondary | ICD-10-CM | POA: Diagnosis present

## 2019-05-25 DIAGNOSIS — Z7902 Long term (current) use of antithrombotics/antiplatelets: Secondary | ICD-10-CM

## 2019-05-25 DIAGNOSIS — Z8673 Personal history of transient ischemic attack (TIA), and cerebral infarction without residual deficits: Secondary | ICD-10-CM | POA: Diagnosis not present

## 2019-05-25 DIAGNOSIS — H409 Unspecified glaucoma: Secondary | ICD-10-CM | POA: Diagnosis present

## 2019-05-25 DIAGNOSIS — F0390 Unspecified dementia without behavioral disturbance: Secondary | ICD-10-CM | POA: Diagnosis present

## 2019-05-25 DIAGNOSIS — Z833 Family history of diabetes mellitus: Secondary | ICD-10-CM

## 2019-05-25 DIAGNOSIS — E785 Hyperlipidemia, unspecified: Secondary | ICD-10-CM | POA: Diagnosis present

## 2019-05-25 DIAGNOSIS — K219 Gastro-esophageal reflux disease without esophagitis: Secondary | ICD-10-CM | POA: Diagnosis present

## 2019-05-25 DIAGNOSIS — Z7982 Long term (current) use of aspirin: Secondary | ICD-10-CM | POA: Diagnosis not present

## 2019-05-25 DIAGNOSIS — M1711 Unilateral primary osteoarthritis, right knee: Secondary | ICD-10-CM | POA: Diagnosis present

## 2019-05-25 DIAGNOSIS — Z79899 Other long term (current) drug therapy: Secondary | ICD-10-CM | POA: Diagnosis not present

## 2019-05-25 DIAGNOSIS — Z96651 Presence of right artificial knee joint: Secondary | ICD-10-CM

## 2019-05-25 DIAGNOSIS — M21061 Valgus deformity, not elsewhere classified, right knee: Secondary | ICD-10-CM | POA: Diagnosis not present

## 2019-05-25 DIAGNOSIS — E119 Type 2 diabetes mellitus without complications: Secondary | ICD-10-CM | POA: Diagnosis not present

## 2019-05-25 DIAGNOSIS — M25761 Osteophyte, right knee: Secondary | ICD-10-CM | POA: Diagnosis present

## 2019-05-25 DIAGNOSIS — I10 Essential (primary) hypertension: Secondary | ICD-10-CM | POA: Diagnosis not present

## 2019-05-25 DIAGNOSIS — Z96659 Presence of unspecified artificial knee joint: Secondary | ICD-10-CM

## 2019-05-25 HISTORY — DX: Unilateral primary osteoarthritis, right knee: M17.11

## 2019-05-25 HISTORY — PX: TOTAL KNEE ARTHROPLASTY: SHX125

## 2019-05-25 LAB — GLUCOSE, CAPILLARY
Glucose-Capillary: 135 mg/dL — ABNORMAL HIGH (ref 70–99)
Glucose-Capillary: 145 mg/dL — ABNORMAL HIGH (ref 70–99)

## 2019-05-25 SURGERY — ARTHROPLASTY, KNEE, TOTAL
Anesthesia: General | Site: Knee | Laterality: Right

## 2019-05-25 MED ORDER — KETOROLAC TROMETHAMINE 30 MG/ML IJ SOLN
INTRAMUSCULAR | Status: AC
  Filled 2019-05-25: qty 1

## 2019-05-25 MED ORDER — LACTATED RINGERS IV SOLN
INTRAVENOUS | Status: DC
  Administered 2019-05-25 (×2): via INTRAVENOUS

## 2019-05-25 MED ORDER — ONDANSETRON HCL 4 MG/2ML IJ SOLN
INTRAMUSCULAR | Status: DC | PRN
  Administered 2019-05-25: 4 mg via INTRAVENOUS

## 2019-05-25 MED ORDER — EPHEDRINE 5 MG/ML INJ
INTRAVENOUS | Status: AC
  Filled 2019-05-25: qty 10

## 2019-05-25 MED ORDER — ZOLPIDEM TARTRATE 5 MG PO TABS: 5.0000 mg | ORAL_TABLET | Freq: Every evening | ORAL | Status: DC | PRN

## 2019-05-25 MED ORDER — TRANEXAMIC ACID-NACL 1000-0.7 MG/100ML-% IV SOLN
1000.0000 mg | INTRAVENOUS | Status: AC
  Administered 2019-05-25: 1000 mg via INTRAVENOUS
  Filled 2019-05-25: qty 100

## 2019-05-25 MED ORDER — METOCLOPRAMIDE HCL 5 MG PO TABS: 5.0000 mg | ORAL_TABLET | Freq: Three times a day (TID) | ORAL | Status: DC | PRN

## 2019-05-25 MED ORDER — 0.9 % SODIUM CHLORIDE (POUR BTL) OPTIME
TOPICAL | Status: DC | PRN
  Administered 2019-05-25: 08:00:00 1000 mL

## 2019-05-25 MED ORDER — HYDROCODONE-ACETAMINOPHEN 5-325 MG PO TABS
1.0000 | ORAL_TABLET | ORAL | Status: DC | PRN
  Filled 2019-05-25: qty 1

## 2019-05-25 MED ORDER — ONDANSETRON HCL 4 MG PO TABS: 4.0000 mg | ORAL_TABLET | Freq: Three times a day (TID) | ORAL | 0 refills | Status: AC | PRN

## 2019-05-25 MED ORDER — TRANEXAMIC ACID-NACL 1000-0.7 MG/100ML-% IV SOLN
1000.0000 mg | Freq: Once | INTRAVENOUS | Status: AC
  Administered 2019-05-25: 14:00:00 1000 mg via INTRAVENOUS
  Filled 2019-05-25: qty 100

## 2019-05-25 MED ORDER — BUPIVACAINE HCL (PF) 0.25 % IJ SOLN
INTRAMUSCULAR | Status: AC
  Filled 2019-05-25: qty 30

## 2019-05-25 MED ORDER — ONDANSETRON HCL 4 MG PO TABS: 4.0000 mg | ORAL_TABLET | Freq: Four times a day (QID) | ORAL | Status: DC | PRN

## 2019-05-25 MED ORDER — VITAMIN B-12 1000 MCG PO TABS
1000.0000 ug | ORAL_TABLET | Freq: Every day | ORAL | Status: DC
  Administered 2019-05-25 – 2019-05-26 (×2): 1000 ug via ORAL
  Filled 2019-05-25 (×2): qty 1

## 2019-05-25 MED ORDER — HYDROMORPHONE HCL 1 MG/ML IJ SOLN: 0.2500 mg | INTRAMUSCULAR | Status: DC | PRN

## 2019-05-25 MED ORDER — DOCUSATE SODIUM 100 MG PO CAPS
100.0000 mg | ORAL_CAPSULE | Freq: Two times a day (BID) | ORAL | Status: DC
  Administered 2019-05-25 – 2019-05-26 (×2): 100 mg via ORAL
  Filled 2019-05-25 (×2): qty 1

## 2019-05-25 MED ORDER — ACETAMINOPHEN 500 MG PO TABS
1000.0000 mg | ORAL_TABLET | Freq: Once | ORAL | Status: AC
  Administered 2019-05-25: 1000 mg via ORAL
  Filled 2019-05-25: qty 2

## 2019-05-25 MED ORDER — HYDROMORPHONE HCL 2 MG/ML IJ SOLN
INTRAMUSCULAR | Status: AC
  Filled 2019-05-25: qty 1

## 2019-05-25 MED ORDER — POTASSIUM CHLORIDE IN NACL 20-0.45 MEQ/L-% IV SOLN
INTRAVENOUS | Status: DC
  Administered 2019-05-25 – 2019-05-26 (×2): via INTRAVENOUS
  Filled 2019-05-25 (×2): qty 1000

## 2019-05-25 MED ORDER — OXYCODONE HCL 5 MG/5ML PO SOLN: 5.0000 mg | Freq: Once | ORAL | Status: DC | PRN

## 2019-05-25 MED ORDER — METHOCARBAMOL 500 MG PO TABS
500.0000 mg | ORAL_TABLET | Freq: Four times a day (QID) | ORAL | Status: DC | PRN
  Administered 2019-05-25: 500 mg via ORAL
  Filled 2019-05-25: qty 1

## 2019-05-25 MED ORDER — KETOROLAC TROMETHAMINE 30 MG/ML IJ SOLN
INTRAMUSCULAR | Status: DC | PRN
  Administered 2019-05-25: 30 mg via INTRA_ARTICULAR

## 2019-05-25 MED ORDER — HYDROMORPHONE HCL 1 MG/ML IJ SOLN
INTRAMUSCULAR | Status: DC | PRN
  Administered 2019-05-25 (×2): 0.5 mg via INTRAVENOUS
  Administered 2019-05-25: 1 mg via INTRAVENOUS

## 2019-05-25 MED ORDER — BISACODYL 10 MG RE SUPP: 10.0000 mg | Freq: Every day | RECTAL | Status: DC | PRN

## 2019-05-25 MED ORDER — ACETAMINOPHEN 500 MG PO TABS
500.0000 mg | ORAL_TABLET | Freq: Four times a day (QID) | ORAL | Status: AC
  Administered 2019-05-25 – 2019-05-26 (×4): 500 mg via ORAL
  Filled 2019-05-25 (×4): qty 1

## 2019-05-25 MED ORDER — PRAVASTATIN SODIUM 20 MG PO TABS
10.0000 mg | ORAL_TABLET | Freq: Every day | ORAL | Status: DC
  Administered 2019-05-25: 10 mg via ORAL
  Filled 2019-05-25: qty 1

## 2019-05-25 MED ORDER — EPHEDRINE SULFATE-NACL 50-0.9 MG/10ML-% IV SOSY
PREFILLED_SYRINGE | INTRAVENOUS | Status: DC | PRN
  Administered 2019-05-25 (×4): 10 mg via INTRAVENOUS

## 2019-05-25 MED ORDER — ACETAMINOPHEN 325 MG PO TABS: 325.0000 mg | ORAL_TABLET | Freq: Four times a day (QID) | ORAL | Status: DC | PRN

## 2019-05-25 MED ORDER — ONDANSETRON HCL 4 MG/2ML IJ SOLN: 4.0000 mg | Freq: Four times a day (QID) | INTRAMUSCULAR | Status: DC | PRN

## 2019-05-25 MED ORDER — OXYCODONE HCL 5 MG PO TABS: 5.0000 mg | ORAL_TABLET | Freq: Once | ORAL | Status: DC | PRN

## 2019-05-25 MED ORDER — METOCLOPRAMIDE HCL 5 MG/ML IJ SOLN: 5.0000 mg | Freq: Three times a day (TID) | INTRAMUSCULAR | Status: DC | PRN

## 2019-05-25 MED ORDER — PHENYLEPHRINE 40 MCG/ML (10ML) SYRINGE FOR IV PUSH (FOR BLOOD PRESSURE SUPPORT)
PREFILLED_SYRINGE | INTRAVENOUS | Status: DC | PRN
  Administered 2019-05-25 (×2): 80 ug via INTRAVENOUS

## 2019-05-25 MED ORDER — BUPIVACAINE HCL 0.25 % IJ SOLN
INTRAMUSCULAR | Status: DC | PRN
  Administered 2019-05-25: 30 mL via INTRA_ARTICULAR

## 2019-05-25 MED ORDER — LIDOCAINE 2% (20 MG/ML) 5 ML SYRINGE
INTRAMUSCULAR | Status: DC | PRN
  Administered 2019-05-25: 100 mg via INTRAVENOUS

## 2019-05-25 MED ORDER — PROMETHAZINE HCL 25 MG/ML IJ SOLN: 6.2500 mg | INTRAMUSCULAR | Status: DC | PRN

## 2019-05-25 MED ORDER — LISINOPRIL 5 MG PO TABS
5.0000 mg | ORAL_TABLET | Freq: Every day | ORAL | Status: DC
  Administered 2019-05-25 – 2019-05-26 (×2): 5 mg via ORAL
  Filled 2019-05-25 (×2): qty 1

## 2019-05-25 MED ORDER — PROPOFOL 10 MG/ML IV BOLUS
INTRAVENOUS | Status: DC | PRN
  Administered 2019-05-25: 160 mg via INTRAVENOUS

## 2019-05-25 MED ORDER — FENTANYL CITRATE (PF) 100 MCG/2ML IJ SOLN
INTRAMUSCULAR | Status: DC | PRN
  Administered 2019-05-25 (×2): 50 ug via INTRAVENOUS

## 2019-05-25 MED ORDER — ROPIVACAINE HCL 5 MG/ML IJ SOLN
INTRAMUSCULAR | Status: DC | PRN
  Administered 2019-05-25: 20 mL via PERINEURAL

## 2019-05-25 MED ORDER — DEXAMETHASONE SODIUM PHOSPHATE 10 MG/ML IJ SOLN
10.0000 mg | Freq: Once | INTRAMUSCULAR | Status: AC
  Administered 2019-05-26: 10:00:00 10 mg via INTRAVENOUS
  Filled 2019-05-25: qty 1

## 2019-05-25 MED ORDER — FENTANYL CITRATE (PF) 100 MCG/2ML IJ SOLN
INTRAMUSCULAR | Status: AC
  Filled 2019-05-25: qty 2

## 2019-05-25 MED ORDER — PROPOFOL 10 MG/ML IV BOLUS
INTRAVENOUS | Status: AC
  Filled 2019-05-25: qty 40

## 2019-05-25 MED ORDER — ASPIRIN EC 325 MG PO TBEC
325.0000 mg | DELAYED_RELEASE_TABLET | Freq: Two times a day (BID) | ORAL | Status: DC
  Administered 2019-05-25 – 2019-05-26 (×3): 325 mg via ORAL
  Filled 2019-05-25 (×3): qty 1

## 2019-05-25 MED ORDER — LIDOCAINE 2% (20 MG/ML) 5 ML SYRINGE
INTRAMUSCULAR | Status: AC
  Filled 2019-05-25: qty 5

## 2019-05-25 MED ORDER — KETOROLAC TROMETHAMINE 15 MG/ML IJ SOLN
7.5000 mg | Freq: Four times a day (QID) | INTRAMUSCULAR | Status: AC
  Administered 2019-05-25 – 2019-05-26 (×4): 7.5 mg via INTRAVENOUS
  Filled 2019-05-25 (×4): qty 1

## 2019-05-25 MED ORDER — MAGNESIUM CITRATE PO SOLN: 1.0000 | Freq: Once | ORAL | Status: DC | PRN

## 2019-05-25 MED ORDER — MENTHOL 3 MG MT LOZG: 1.0000 | LOZENGE | OROMUCOSAL | Status: DC | PRN

## 2019-05-25 MED ORDER — MORPHINE SULFATE (PF) 2 MG/ML IV SOLN
0.5000 mg | INTRAVENOUS | Status: DC | PRN
  Administered 2019-05-25: 21:00:00 0.5 mg via INTRAVENOUS
  Filled 2019-05-25: qty 1

## 2019-05-25 MED ORDER — POVIDONE-IODINE 10 % EX SWAB
2.0000 "application " | Freq: Once | CUTANEOUS | Status: AC
  Administered 2019-05-25: 2 via TOPICAL

## 2019-05-25 MED ORDER — WATER FOR IRRIGATION, STERILE IR SOLN
Status: DC | PRN
  Administered 2019-05-25: 2000 mL

## 2019-05-25 MED ORDER — RIVAROXABAN 10 MG PO TABS: 10.0000 mg | ORAL_TABLET | Freq: Every day | ORAL | 0 refills | Status: AC

## 2019-05-25 MED ORDER — CEFAZOLIN SODIUM-DEXTROSE 2-4 GM/100ML-% IV SOLN
2.0000 g | INTRAVENOUS | Status: AC
  Administered 2019-05-25: 2 g via INTRAVENOUS
  Filled 2019-05-25: qty 100

## 2019-05-25 MED ORDER — CEFAZOLIN SODIUM-DEXTROSE 2-4 GM/100ML-% IV SOLN
2.0000 g | Freq: Four times a day (QID) | INTRAVENOUS | Status: AC
  Administered 2019-05-25 (×2): 2 g via INTRAVENOUS
  Filled 2019-05-25 (×2): qty 100

## 2019-05-25 MED ORDER — SENNA-DOCUSATE SODIUM 8.6-50 MG PO TABS: 2.0000 | ORAL_TABLET | Freq: Every day | ORAL | 1 refills | Status: AC

## 2019-05-25 MED ORDER — HYDROCODONE-ACETAMINOPHEN 10-325 MG PO TABS: 1.0000 | ORAL_TABLET | Freq: Four times a day (QID) | ORAL | 0 refills | Status: AC | PRN

## 2019-05-25 MED ORDER — CHLORHEXIDINE GLUCONATE 4 % EX LIQD: 60.0000 mL | Freq: Once | CUTANEOUS | Status: DC

## 2019-05-25 MED ORDER — HYDROCODONE-ACETAMINOPHEN 7.5-325 MG PO TABS: 1.0000 | ORAL_TABLET | ORAL | Status: DC | PRN

## 2019-05-25 MED ORDER — BACLOFEN 10 MG PO TABS: 10.0000 mg | ORAL_TABLET | Freq: Three times a day (TID) | ORAL | 0 refills | Status: AC

## 2019-05-25 MED ORDER — POLYETHYLENE GLYCOL 3350 17 G PO PACK: 17.0000 g | PACK | Freq: Every day | ORAL | Status: DC | PRN

## 2019-05-25 MED ORDER — DIPHENHYDRAMINE HCL 12.5 MG/5ML PO ELIX: 12.5000 mg | ORAL_SOLUTION | ORAL | Status: DC | PRN

## 2019-05-25 MED ORDER — PHENOL 1.4 % MT LIQD: 1.0000 | OROMUCOSAL | Status: DC | PRN

## 2019-05-25 MED ORDER — SODIUM CHLORIDE 0.9 % IR SOLN
Status: DC | PRN
  Administered 2019-05-25: 1000 mL

## 2019-05-25 MED ORDER — ADULT MULTIVITAMIN W/MINERALS CH
1.0000 | ORAL_TABLET | Freq: Every day | ORAL | Status: DC
  Administered 2019-05-26: 1 via ORAL
  Filled 2019-05-25: qty 1

## 2019-05-25 MED ORDER — ALUM & MAG HYDROXIDE-SIMETH 200-200-20 MG/5ML PO SUSP: 30.0000 mL | ORAL | Status: DC | PRN

## 2019-05-25 MED ORDER — METHOCARBAMOL 500 MG IVPB - SIMPLE MED
500.0000 mg | Freq: Four times a day (QID) | INTRAVENOUS | Status: DC | PRN
  Filled 2019-05-25: qty 50

## 2019-05-25 MED ORDER — LATANOPROST 0.005 % OP SOLN
1.0000 [drp] | Freq: Every day | OPHTHALMIC | Status: DC
  Administered 2019-05-25: 22:00:00 1 [drp] via OPHTHALMIC
  Filled 2019-05-25: qty 2.5

## 2019-05-25 SURGICAL SUPPLY — 55 items
BAG SPEC THK2 15X12 ZIP CLS (MISCELLANEOUS)
BAG ZIPLOCK 12X15 (MISCELLANEOUS) IMPLANT
BLADE SAG 18X100X1.27 (BLADE) ×4 IMPLANT
BLADE SURG 15 STRL LF DISP TIS (BLADE) ×1 IMPLANT
BLADE SURG 15 STRL SS (BLADE) ×2
BLADE SURG SZ10 CARB STEEL (BLADE) ×4 IMPLANT
BNDG CMPR MED 15X6 ELC VLCR LF (GAUZE/BANDAGES/DRESSINGS) ×1
BNDG ELASTIC 6X15 VLCR STRL LF (GAUZE/BANDAGES/DRESSINGS) ×2 IMPLANT
BOWL SMART MIX CTS (DISPOSABLE) ×2 IMPLANT
BRNG TIB 0D 79/83X10 POST (Insert) ×1 IMPLANT
CEMENT BONE R 1X40 (Cement) ×4 IMPLANT
CLSR STERI-STRIP ANTIMIC 1/2X4 (GAUZE/BANDAGES/DRESSINGS) ×4 IMPLANT
COMP FEM VG IL 70 RT (Knees) ×2 IMPLANT
COMPONENT FEM VG IL 70 RT (Knees) IMPLANT
COVER SURGICAL LIGHT HANDLE (MISCELLANEOUS) ×2 IMPLANT
COVER WAND RF STERILE (DRAPES) IMPLANT
CUFF TOURN SGL QUICK 34 (TOURNIQUET CUFF) ×2
CUFF TRNQT CYL 34X4.125X (TOURNIQUET CUFF) ×1 IMPLANT
DECANTER SPIKE VIAL GLASS SM (MISCELLANEOUS) IMPLANT
DISTAL FEMORAL PEG (Knees) ×2 IMPLANT
DRAPE U-SHAPE 47X51 STRL (DRAPES) ×2 IMPLANT
DRSG MEPILEX BORDER 4X8 (GAUZE/BANDAGES/DRESSINGS) ×2 IMPLANT
DRSG PAD ABDOMINAL 8X10 ST (GAUZE/BANDAGES/DRESSINGS) ×2 IMPLANT
DURAPREP 26ML APPLICATOR (WOUND CARE) ×4 IMPLANT
ELECT REM PT RETURN 15FT ADLT (MISCELLANEOUS) ×2 IMPLANT
GLOVE BIO SURGEON STRL SZ7.5 (GLOVE) ×2 IMPLANT
GLOVE BIO SURGEON STRL SZ8 (GLOVE) ×2 IMPLANT
GLOVE BIOGEL PI IND STRL 8 (GLOVE) ×2 IMPLANT
GLOVE BIOGEL PI INDICATOR 8 (GLOVE) ×2
GOWN STRL REUS W/TWL 2XL LVL3 (GOWN DISPOSABLE) ×2 IMPLANT
GOWN STRL REUS W/TWL LRG LVL3 (GOWN DISPOSABLE) ×2 IMPLANT
HANDPIECE INTERPULSE COAX TIP (DISPOSABLE) ×2
HOLDER FOLEY CATH W/STRAP (MISCELLANEOUS) IMPLANT
HOOD PEEL AWAY FLYTE STAYCOOL (MISCELLANEOUS) ×6 IMPLANT
IMMOBILIZER KNEE 20 (SOFTGOODS) ×4
IMMOBILIZER KNEE 20 THIGH 36 (SOFTGOODS) ×1 IMPLANT
INSERT TIB BEARING VG 79/83X10 (Insert) ×1 IMPLANT
KIT TURNOVER KIT A (KITS) IMPLANT
MANIFOLD NEPTUNE II (INSTRUMENTS) ×2 IMPLANT
NS IRRIG 1000ML POUR BTL (IV SOLUTION) ×2 IMPLANT
PACK ICE MAXI GEL EZY WRAP (MISCELLANEOUS) ×2 IMPLANT
PACK TOTAL KNEE CUSTOM (KITS) ×2 IMPLANT
PEG FEMORAL DISTAL (Knees) IMPLANT
PEG PATELLA SERIES A 37MMX10MM (Orthopedic Implant) ×1 IMPLANT
PLATE KNEE TIBIAL 79MM FIXED (Plate) ×1 IMPLANT
PROTECTOR NERVE ULNAR (MISCELLANEOUS) ×2 IMPLANT
SET HNDPC FAN SPRY TIP SCT (DISPOSABLE) ×1 IMPLANT
STRIP CLOSURE SKIN 1/2X4 (GAUZE/BANDAGES/DRESSINGS) ×2 IMPLANT
SUT VIC AB 0 CT1 36 (SUTURE) ×2 IMPLANT
SUT VIC AB 2-0 CT1 27 (SUTURE) ×2
SUT VIC AB 2-0 CT1 TAPERPNT 27 (SUTURE) ×1 IMPLANT
SUT VIC AB 3-0 SH 8-18 (SUTURE) ×2 IMPLANT
TRAY FOLEY MTR SLVR 16FR STAT (SET/KITS/TRAYS/PACK) ×2 IMPLANT
WATER STERILE IRR 1000ML POUR (IV SOLUTION) ×4 IMPLANT
WRAP KNEE MAXI GEL POST OP (GAUZE/BANDAGES/DRESSINGS) ×1 IMPLANT

## 2019-05-25 NOTE — Care Plan (Signed)
Ortho Bundle Case Management Note  Patient Details  Name: Jonathan Daniels. MRN: [id number] Date of Birth: 02/18/1936  Spoke with patient and wife prior to surgery. He plans to discharge to home with her to assist. HHPT referral to [facility]. He has all needed equipment at home. Patient and MD aware of plan and agreeable. Choice offered.                     DME Arranged:    DME Agency:     HH Arranged:  PT HH Agency:  [facility] ([facility]  Additional Comments: Please contact me with any questions of if this plan should need to change.  Ladell Heads,  [facility]  [phone] 05/25/2019, 8:50 AM

## 2019-05-25 NOTE — Op Note (Signed)
DATE OF SURGERY:  05/25/2019 TIME: 9:10 AM  PATIENT NAME:  Jonathan FearingJames N Baiza Jr.   AGE: 83 y.o.    PRE-OPERATIVE DIAGNOSIS:  RIGHT knee primary localized severe valgus osteoarthritis, history of patellar tendon rupture  POST-OPERATIVE DIAGNOSIS:  Same  PROCEDURE:  RIGHT Total Knee Arthroplasty  SURGEON:  Eulas PostJoshua P Harjas Biggins, MD   ASSISTANT:  Janace LittenBrandon Parry, OPA-C, present and scrubbed throughout the case, critical for assistance with exposure, retraction, instrumentation, and closure.  ANESTHESIA: General with adductor block  OPERATIVE IMPLANTS: Biomet Vanguard Fixed Bearing Posterior Stabilized Femur size 70, Tibia size 79, Patella size 37 3-peg oval button, with a 10 mm polyethylene insert.   PREOPERATIVE INDICATIONS:  Jonathan FearingJames N Starkman Jr. is a 83 y.o. year old male with end stage bone on bone degenerative arthritis of the knee who failed conservative treatment, including injections, antiinflammatories, activity modification, and assistive devices, and had significant impairment of their activities of daily living, and elected for Total Knee Arthroplasty.   The risks, benefits, and alternatives were discussed at length including but not limited to the risks of infection, bleeding, nerve injury, stiffness, blood clots, the need for revision surgery, cardiopulmonary complications, among others, and they were willing to proceed.  OPERATIVE FINDINGS AND UNIQUE ASPECTS OF THE CASE:  Severe valgus deformity.  Poor bone quality.  The patella measured 19 at its thickest, probably 12 or even 11 at the lateral facet.  I didn't cut the lateral side, but got 2 holes by orienting the pegs more vertically than normally, and had the lateral peg in bone partially with a cement build up on the lateral facet.  He tracked well at the completion of the case, and I had 80% flat surface.  The lateral tibia was scalloped out.  I cut the femur twice.  I skim cut the lateral side.  The patellar tendon was well healed.     ESTIMATED BLOOD LOSS: 150 ml  OPERATIVE DESCRIPTION:  The patient was brought to the operative room and placed in a supine position.  Anesthesia was administered.  IV antibiotics were given.  The lower extremity was prepped and draped in the usual sterile fashion.  Time out was performed.  The leg was elevated and exsanguinated and the tourniquet was inflated.  Anterior quadriceps tendon splitting approach was performed.  The patella was everted and osteophytes were removed.  The anterior horn of the medial and lateral meniscus was removed.   The distal femur was opened with the drill and the intramedullary distal femoral cutting jig was utilized, set at 5 degrees resecting 9 mm off the distal femur.  Care was taken to protect the collateral ligaments.  Then the extramedullary tibial cutting jig was utilized making the appropriate cut using the anterior tibial crest as a reference building in appropriate posterior slope.  Care was taken during the cut to protect the medial and collateral ligaments.  The proximal tibia was removed along with the posterior horns of the menisci.  The PCL was sacrificed.  The gap was 8 and I took 2 more off the femur.  I was already low on the tibia from his preexisting wear.  The extensor gap was measured and was approximately 10mm.    The distal femoral sizing jig was applied, taking care to avoid notching.  Then the 4-in-1 cutting jig was applied and the anterior and posterior femur was cut, along with the chamfer cuts.  All posterior osteophytes were removed.  The flexion gap was then measured and  was symmetric with the extension gap.  I completed the distal femoral preparation using the appropriate jig to prepare the box.  The patella was then measured, and cut with the saw.  The thickness before the cut was 19 and after the cut was 14, with a lateral scallop as described above.  The proximal tibia sized and prepared accordingly with the reamer and the  punch, and then all components were trialed with the 59mm poly insert.  The knee was found to have excellent balance and full motion.    The above named components were then cemented into place and all excess cement was removed.  The real polyethylene implant was placed.  After the cement had cured I released the tourniquet and confirmed excellent hemostasis with no major posterior vessel injury.    The knee was easily taken through a range of motion and the patella tracked well and the knee irrigated copiously and the parapatellar and subcutaneous tissue closed with vicryl, and monocryl with steri strips for the skin.  The wounds were injected with marcaine, and dressed with sterile gauze and the patient was awakened and returned to the PACU in stable and satisfactory condition.  There were no complications.  Total tourniquet time was ~85 minutes.

## 2019-05-25 NOTE — Anesthesia Procedure Notes (Signed)
Procedure Name: LMA Insertion Date/Time: 05/25/2019 7:35 AM Performed by: Kinslea Frances D, CRNA Pre-anesthesia Checklist: Patient identified, Emergency Drugs available, Suction available and Patient being monitored Patient Re-evaluated:Patient Re-evaluated prior to induction Oxygen Delivery Method: Circle system utilized Preoxygenation: Pre-oxygenation with 100% oxygen Induction Type: IV induction Ventilation: Mask ventilation without difficulty LMA Size: 4.0 Tube type: Oral Number of attempts: 1 Placement Confirmation: positive ETCO2 and breath sounds checked- equal and bilateral Tube secured with: Tape Dental Injury: Teeth and Oropharynx as per pre-operative assessment

## 2019-05-25 NOTE — Transfer of Care (Signed)
Immediate Anesthesia Transfer of Care Note  Patient: Jonathan Daniels.  Procedure(s) Performed: TOTAL KNEE ARTHROPLASTY (Right Knee)  Patient Location: PACU  Anesthesia Type:General and Regional  Level of Consciousness: awake, oriented and sedated  Airway & Oxygen Therapy: Patient Spontanous Breathing and Patient connected to nasal cannula oxygen  Post-op Assessment: Report given to RN and Post -op Vital signs reviewed and stable  Post vital signs: Reviewed and stable  Last Vitals:  Vitals Value Taken Time  BP 161/102 05/25/19 0957  Temp    Pulse 91 05/25/19 0957  Resp 7 05/25/19 0957  SpO2 98 % 05/25/19 0957  Vitals shown include unvalidated device data.  Last Pain:  Vitals:   05/25/19 0619  TempSrc: Oral         Complications: No apparent anesthesia complications

## 2019-05-25 NOTE — Anesthesia Postprocedure Evaluation (Signed)
Anesthesia Post Note  Patient: Jonathan Daniels.  Procedure(s) Performed: TOTAL KNEE ARTHROPLASTY (Right Knee)     Patient location during evaluation: PACU Anesthesia Type: General Level of consciousness: awake and alert Pain management: pain level controlled Vital Signs Assessment: post-procedure vital signs reviewed and stable Respiratory status: spontaneous breathing, nonlabored ventilation and respiratory function stable Cardiovascular status: blood pressure returned to baseline and stable Postop Assessment: no apparent nausea or vomiting Anesthetic complications: no    Last Vitals:  Vitals:   05/25/19 1115 05/25/19 1130  BP: (!) 154/80 (!) 145/79  Pulse: 93 78  Resp: (!) 8 (!) 5  Temp:    SpO2: 99% 98%    Last Pain:  Vitals:   05/25/19 1130  TempSrc:   PainSc: Asleep    LLE Motor Response: Purposeful movement (05/25/19 1130)   RLE Motor Response: Purposeful movement (05/25/19 1130)        Lynda Rainwater

## 2019-05-25 NOTE — Anesthesia Procedure Notes (Signed)
Anesthesia Regional Block: Adductor canal block   Pre-Anesthetic Checklist: ,, timeout performed, Correct Patient, Correct Site, Correct Laterality, Correct Procedure, Correct Position, site marked, Risks and benefits discussed,  Surgical consent,  Pre-op evaluation,  At surgeon's request and post-op pain management  Laterality: Right  Prep: chloraprep       Needles:  Injection technique: Single-shot  Needle Type: Stimiplex     Needle Length: 9cm  Needle Gauge: 21     Additional Needles:   Procedures:,,,, ultrasound used (permanent image in chart),,,,  Narrative:  Start time: 05/25/2019 7:03 AM End time: 05/25/2019 7:08 AM Injection made incrementally with aspirations every 5 mL.  Performed by: Personally  Anesthesiologist: Lynda Rainwater, MD

## 2019-05-25 NOTE — Evaluation (Signed)
Physical Therapy Evaluation Patient Details Name: Jonathan FearingJames N Giacobbe Jr. MRN: [id number] DOB: 10/23/1936 Today's Date: 05/25/2019   History of Present Illness  83 yo male s/p R TKR on 05/25/19. PMH includes OA, CVA, GERD, glaucoma, HTN, HLD, biateral patellar tendon repair, R RTC repair, L THA.  Clinical Impression   Pt presents with mild R knee pain, decreased R knee ROM, increased time and effort to perform mobility tasks, and decreased activity tolerance due to R knee pain and fatigue. Pt to benefit from acute PT to address deficits. Pt ambulated room distance with RW with min guard assist, verbal cuing for form and safety provided throughout. Pt educated on ankle pumps (20/hour) to perform this afternoon/evening to increase circulation, to pt's tolerance and limited by pain. PT to progress mobility as tolerated, and will continue to follow acutely.        Follow Up Recommendations Follow surgeon's recommendation for DC plan and follow-up therapies;Supervision for mobility/OOB(HHPT)    Equipment Recommendations  None recommended by PT    Recommendations for Other Services       Precautions / Restrictions Precautions Precautions: Fall Restrictions Weight Bearing Restrictions: No Other Position/Activity Restrictions: WBAT      Mobility  Bed Mobility Overal bed mobility: Needs Assistance Bed Mobility: Supine to Sit     Supine to sit: Min assist;HOB elevated     General bed mobility comments: Min assist for RLE lifting and translation to EOB, scooting to EOB with use of bed pad. Increased time and effort.  Transfers Overall transfer level: Needs assistance Equipment used: Rolling walker (2 wheeled) Transfers: Sit to/from Stand Sit to Stand: Min assist;From elevated surface         General transfer comment: Min assist for power up, steadying. Verbal cuing for hand placement when rising.  Ambulation/Gait Ambulation/Gait assistance: Min guard Gait Distance (Feet): 20  Feet Assistive device: Rolling walker (2 wheeled) Gait Pattern/deviations: Step-to pattern;Decreased step length - right;Decreased step length - left;Antalgic;Decreased weight shift to right Gait velocity: decr   General Gait Details: Min guard for safety. Verbal cuing for placement in RW, upright posture, sequencing. Pt limited in ambulation distance by pain.  Stairs            Wheelchair Mobility    Modified Rankin (Stroke Patients Only)       Balance Overall balance assessment: Mild deficits observed, not formally tested                                           Pertinent Vitals/Pain Pain Assessment: 0-10 Pain Score: 3  Pain Location: R knee Pain Descriptors / Indicators: Sore Pain Intervention(s): Limited activity within patient's tolerance;Monitored during session;Premedicated before session;Repositioned    Home Living Family/patient expects to be discharged to:: Private residence Living Arrangements: Spouse/significant other Available Help at Discharge: Family;Available 24 hours/day Type of Home: House Home Access: Stairs to enter Entrance Stairs-Rails: Right Entrance Stairs-Number of Steps: 3 Home Layout: Two level;Able to live on main level with bedroom/bathroom Home Equipment: Dan HumphreysWalker - 2 wheels;Bedside commode;Cane - single point;Shower seat      Prior Function Level of Independence: Independent with assistive device(s)         Comments: Pt reports using cane and RW interchangably PTA as needed     Hand Dominance   Dominant Hand: Right    Extremity/Trunk Assessment   Upper Extremity Assessment Upper Extremity Assessment:  Overall WFL for tasks assessed    Lower Extremity Assessment Lower Extremity Assessment: Generalized weakness;RLE deficits/detail RLE Deficits / Details: suspected post-surgical weakness; able to perform ankle pumps, quad set, heel slide to 45* limited by pain, and SLR with 10* quad lag without lift  assist RLE Sensation: WNL    Cervical / Trunk Assessment Cervical / Trunk Assessment: Normal  Communication   Communication: HOH  Cognition Arousal/Alertness: Awake/alert Behavior During Therapy: WFL for tasks assessed/performed Overall Cognitive Status: Within Functional Limits for tasks assessed                                        General Comments      Exercises     Assessment/Plan    PT Assessment Patient needs continued PT services  PT Problem List Decreased strength;Decreased mobility;Decreased range of motion;Decreased activity tolerance;Decreased balance;Decreased knowledge of use of DME;Pain       PT Treatment Interventions DME instruction;Therapeutic activities;Gait training;Therapeutic exercise;Patient/family education;Balance training;Stair training;Functional mobility training    PT Goals (Current goals can be found in the Care Plan section)  Acute Rehab PT Goals Patient Stated Goal: go home PT Goal Formulation: With patient Time For Goal Achievement: 06/01/19 Potential to Achieve Goals: Good    Frequency 7X/week   Barriers to discharge        Co-evaluation               AM-PAC PT "6 Clicks" Mobility  Outcome Measure Help needed turning from your back to your side while in a flat bed without using bedrails?: A Little Help needed moving from lying on your back to sitting on the side of a flat bed without using bedrails?: A Little Help needed moving to and from a bed to a chair (including a wheelchair)?: A Little Help needed standing up from a chair using your arms (e.g., wheelchair or bedside chair)?: A Little Help needed to walk in hospital room?: A Little Help needed climbing 3-5 steps with a railing? : A Lot 6 Click Score: 17    End of Session Equipment Utilized During Treatment: Gait belt Activity Tolerance: Patient tolerated treatment well;Patient limited by pain;Patient limited by fatigue Patient left: in chair;with  call bell/phone within reach;with chair alarm set;with SCD's reapplied Nurse Communication: Mobility status PT Visit Diagnosis: Difficulty in walking, not elsewhere classified (R26.2);Other abnormalities of gait and mobility (R26.89)    Time: 1245-8099 PT Time Calculation (min) (ACUTE ONLY): 28 min   Charges:   PT Evaluation $PT Eval Low Complexity: 1 Low PT Treatments $Gait Training: 8-22 mins       Julien Girt, PT Acute Rehabilitation Services Pager [phone]  Office [phone]  Roxine Caddy D Elonda Husky 05/25/2019, 6:45 PM

## 2019-05-25 NOTE — H&P (Signed)
PREOPERATIVE H&P  Chief Complaint: Right knee pain  HPI: Jonathan FearingJames N Parrales Jr. is a 83 y.o. male who presents for preoperative history and physical with a diagnosis of right knee osteoarthritis. Symptoms are rated as moderate to severe, and have been worsening.  This is significantly impairing activities of daily living.  He has elected for surgical management.   He has failed injections, activity modification, anti-inflammatories, and assistive devices.  Preoperative X-rays demonstrate end stage degenerative changes with osteophyte formation, loss of joint space, subchondral sclerosis.  He has had a previous patellar tendon disruption on the right knee years ago, and had a surgical repair.  Past Medical History:  Diagnosis Date  . Arthritis    "right knee" (05/28/2018)  . BPH (benign prostatic hyperplasia)    but not on any meds  . CVA (cerebral vascular accident) (HCC) 05/28/2018   "left mouth droopy" (05/28/2018)   no longer present 05-18-19  . GERD (gastroesophageal reflux disease)   . Glaucoma    uses eye drops daily  . History of loop recorder    present at preop 05-18-19  . Hyperlipidemia    was on med but has been off for a yr-medical md is aware  . Hypertension    was on med but has been off a yr-medical md is aware  . Joint swelling   . Primary osteoarthritis of left hip 05/27/2013  . Type II diabetes mellitus (HCC)    only takes Cinnamon  . Urinary frequency    Past Surgical History:  Procedure Laterality Date  . CARDIAC CATHETERIZATION  2011  . COLONOSCOPY    . JOINT REPLACEMENT    . LOOP RECORDER INSERTION N/A 06/01/2018   Procedure: LOOP RECORDER INSERTION;  Surgeon: Duke SalviaKlein, Steven C, MD;  [facility];  Service: Cardiovascular;  Laterality: N/A;  . PATELLAR TENDON REPAIR Bilateral   . SHOULDER OPEN ROTATOR CUFF REPAIR Right   . TEE WITHOUT CARDIOVERSION N/A 06/01/2018   Procedure: TRANSESOPHAGEAL ECHOCARDIOGRAM (TEE);  Surgeon: Chilton Siandolph, Tiffany, MD;   [facility];  Service: Cardiovascular;  Laterality: N/A;  . TOTAL HIP ARTHROPLASTY Left 02/08/2014   Procedure: TOTAL HIP ARTHROPLASTY;  Surgeon: Eulas PostJoshua P Syra Sirmons, MD;  Location: [facility] OR;  Service: Orthopedics;  Laterality: Left;   Social History   Socioeconomic History  . Marital status: Married    Spouse name: Not on file  . Number of children: Not on file  . Years of education: Not on file  . Highest education level: Not on file  Occupational History  . Not on file  Social Needs  . Financial resource strain: Not on file  . Food insecurity    Worry: Not on file    Inability: Not on file  . Transportation needs    Medical: Not on file    Non-medical: Not on file  Tobacco Use  . Smoking status: Never Smoker  . Smokeless tobacco: Never Used  Substance and Sexual Activity  . Alcohol use: Not Currently    Comment: 05/28/2018 "couple times/year"  . Drug use: Never  . Sexual activity: Yes  Lifestyle  . Physical activity    Days per week: Not on file    Minutes per session: Not on file  . Stress: Not on file  Relationships  . Social Musicianconnections    Talks on phone: Not on file    Gets together: Not on file    Attends religious service: Not on file    Active member of club  or organization: Not on file    Attends meetings of clubs or organizations: Not on file    Relationship status: Not on file  Other Topics Concern  . Not on file  Social History Narrative  . Not on file   Family History  Problem Relation Age of Onset  . Kidney disease Other   . Diabetes Father   . Cancer Neg Hx   . Early death Neg Hx   . Heart disease Neg Hx   . Hypertension Neg Hx   . Hyperlipidemia Neg Hx   . Stroke Neg Hx    No Known Allergies Prior to Admission medications   Medication Sig Start Date End Date Taking? Authorizing Provider  aspirin EC 81 MG tablet Take 81 mg by mouth daily.   Yes [provider]  Brinzolamide-Brimonidine 1-0.2 % SUSP Place 1 drop into both eyes  daily.   Yes [provider]  clopidogrel (PLAVIX) 75 MG tablet Take 1 tablet (75 mg total) by mouth daily. 06/02/18  Yes Meccariello, Bernita Raisin, DO  latanoprost (XALATAN) 0.005 % ophthalmic solution Place 1 drop into both eyes at bedtime.   Yes [provider]  lisinopril (ZESTRIL) 5 MG tablet Take 5 mg by mouth daily.   Yes [provider]  Multiple Vitamin (MULTIVITAMIN WITH MINERALS) TABS tablet Take 1 tablet by mouth daily. 06/02/18  Yes Meccariello, Bernita Raisin, DO  pravastatin (PRAVACHOL) 10 MG tablet Take 10 mg by mouth at bedtime.   Yes [provider]  vitamin B-12 (CYANOCOBALAMIN) 1000 MCG tablet Take 1,000 mcg by mouth daily.   Yes [provider]  Cinnamon 500 MG capsule Take 500 mg by mouth daily.    [provider]     Positive ROS: All other systems have been reviewed and were otherwise negative with the exception of those mentioned in the HPI and as above.  Physical Exam: General: Alert, no acute distress Cardiovascular: No pedal edema Respiratory: No cyanosis, no use of accessory musculature GI: No organomegaly, abdomen is soft and non-tender Skin: No lesions in the area of chief complaint Neurologic: Sensation intact distally Psychiatric: Patient is competent for consent with normal mood and affect Lymphatic: No axillary or cervical lymphadenopathy  MUSCULOSKELETAL: Right knee has significant effusion, valgus alignment, intact patellar tendon with extension, well-healed transverse surgical scar.  Assessment: Right knee valgus osteoarthritis with history of patellar tendon rupture   Plan: Plan for Procedure(s): TOTAL KNEE ARTHROPLASTY  The risks benefits and alternatives were discussed with the patient including but not limited to the risks of nonoperative treatment, versus surgical intervention including infection, bleeding, nerve injury,  blood clots, cardiopulmonary complications, morbidity, mortality, among others,  and they were willing to proceed.   Anticipated LOS equal to or greater than 2 midnights due to - Age 61 and older with one or more of the following:  - Obesity  - Expected need for hospital services (PT, OT, Nursing) required for safe  discharge  - Anticipated need for postoperative skilled nursing care or inpatient rehab  - Active co-morbidities: Stroke      Johnny Bridge, MD Cell [phone]   05/25/2019 7:20 AM

## 2019-05-25 NOTE — Discharge Instructions (Signed)

## 2019-05-25 NOTE — Plan of Care (Signed)

## 2019-05-26 ENCOUNTER — Encounter (HOSPITAL_COMMUNITY): Payer: Self-pay | Admitting: Orthopedic Surgery

## 2019-05-26 DIAGNOSIS — M1711 Unilateral primary osteoarthritis, right knee: Secondary | ICD-10-CM | POA: Diagnosis not present

## 2019-05-26 DIAGNOSIS — Z96659 Presence of unspecified artificial knee joint: Secondary | ICD-10-CM

## 2019-05-26 LAB — BASIC METABOLIC PANEL
Anion gap: 7 (ref 5–15)
BUN: 12 mg/dL (ref 8–23)
CO2: 24 mmol/L (ref 22–32)
Calcium: 8.6 mg/dL — ABNORMAL LOW (ref 8.9–10.3)
Chloride: 109 mmol/L (ref 98–111)
Creatinine, Ser: 1.04 mg/dL (ref 0.61–1.24)
GFR calc Af Amer: >60 mL/min (ref >60)
GFR calc non Af Amer: >60 mL/min (ref >60)
Glucose, Bld: 132 mg/dL — ABNORMAL HIGH (ref 70–99)
Potassium: 4 mmol/L (ref 3.5–5.1)
Sodium: 140 mmol/L (ref 135–145)

## 2019-05-26 LAB — CBC
HCT: 33.3 % — ABNORMAL LOW (ref 39.0–52.0)
Hemoglobin: 10.4 g/dL — ABNORMAL LOW (ref 13.0–17.0)
MCH: 28.4 pg (ref 26.0–34.0)
MCHC: 31.2 g/dL (ref 30.0–36.0)
MCV: 91 fL (ref 80.0–100.0)
Platelets: 153 10*3/uL (ref 150–400)
RBC: 3.66 MIL/uL — ABNORMAL LOW (ref 4.22–5.81)
RDW: 14.6 % (ref 11.5–15.5)
WBC: 9.4 10*3/uL (ref 4.0–10.5)
nRBC: 0 % (ref 0.0–0.2)

## 2019-05-26 LAB — GLUCOSE, CAPILLARY
Glucose-Capillary: 137 mg/dL — ABNORMAL HIGH (ref 70–99)
Glucose-Capillary: 151 mg/dL — ABNORMAL HIGH (ref 70–99)
Glucose-Capillary: 93 mg/dL (ref 70–99)

## 2019-05-26 NOTE — Progress Notes (Signed)
Physical Therapy Treatment Patient Details Name: Jonathan FearingJames N Ostrander Jr. MRN: [id number] DOB: 02/05/1936 Today's Date: 05/26/2019    History of Present Illness 83 yo male s/p R TKR on 05/25/19. PMH includes OA, CVA, GERD, glaucoma, HTN, HLD, biateral patellar tendon repair, R RTC repair, L THA.    PT Comments    POD # 1 am session Assisted OOB.  Demonstrated and instructed pt how to use belt to self assist LE as a leg lifter Assisted with amb.  General Gait Details: 25% VC's on proper walker to self distance and upright posture Then returned to room to perform some TE's following HEP handout.  Instructed on proper tech, freq as well as use of ICE.   Pt will need another PT session to address stairs and complete HEP.   Follow Up Recommendations  Follow surgeon's recommendation for DC plan and follow-up therapies;Supervision for mobility/OOB     Equipment Recommendations  None recommended by PT    Recommendations for Other Services       Precautions / Restrictions Precautions Precautions: Fall Restrictions Weight Bearing Restrictions: No Other Position/Activity Restrictions: WBAT    Mobility  Bed Mobility Overal bed mobility: Needs Assistance Bed Mobility: Supine to Sit     Supine to sit: Min assist;HOB elevated     General bed mobility comments: increased time  Transfers Overall transfer level: Needs assistance Equipment used: Rolling walker (2 wheeled) Transfers: Sit to/from Stand Sit to Stand: Min assist;From elevated surface;Min guard         General transfer comment: 255 VC's on proper hand placement and safety with turns  Ambulation/Gait Ambulation/Gait assistance: Min guard Gait Distance (Feet): 25 Feet Assistive device: Rolling walker (2 wheeled) Gait Pattern/deviations: Step-to pattern;Decreased step length - right;Decreased step length - left;Antalgic;Decreased weight shift to right Gait velocity: decr   General Gait Details: 25% VC's on proper walker to  self distance and upright posture   Stairs             Wheelchair Mobility    Modified Rankin (Stroke Patients Only)       Balance                                            Cognition Arousal/Alertness: Awake/alert   Overall Cognitive Status: Within Functional Limits for tasks assessed                                        Exercises   Total Knee Replacement TE's 10 reps B LE ankle pumps 10 reps towel squeezes 10 reps knee presses 10 reps heel slides   Followed by ICE     General Comments        Pertinent Vitals/Pain Pain Assessment: 0-10 Pain Score: 3  Pain Location: R knee Pain Descriptors / Indicators: Sore Pain Intervention(s): Monitored during session;Premedicated before session;Repositioned;Ice applied    Home Living                      Prior Function            PT Goals (current goals can now be found in the care plan section)      Frequency    7X/week      PT Plan      Co-evaluation  AM-PAC PT "6 Clicks" Mobility   Outcome Measure  Help needed turning from your back to your side while in a flat bed without using bedrails?: A Little Help needed moving from lying on your back to sitting on the side of a flat bed without using bedrails?: A Little Help needed moving to and from a bed to a chair (including a wheelchair)?: A Little Help needed standing up from a chair using your arms (e.g., wheelchair or bedside chair)?: A Little Help needed to walk in hospital room?: A Little Help needed climbing 3-5 steps with a railing? : A Lot 6 Click Score: 17    End of Session Equipment Utilized During Treatment: Gait belt Activity Tolerance: Patient tolerated treatment well;Patient limited by pain;Patient limited by fatigue Patient left: in chair;with call bell/phone within reach;with chair alarm set;with SCD's reapplied Nurse Communication: Mobility status PT Visit Diagnosis:  Difficulty in walking, not elsewhere classified (R26.2);Other abnormalities of gait and mobility (R26.89)     Time: 8341-9622 PT Time Calculation (min) (ACUTE ONLY): 24 min  Charges:  $Gait Training: 8-22 mins $Therapeutic Exercise: 8-22 mins                     Rica Koyanagi  PTA Acute  Rehabilitation Services Pager      [phone] Office      [phone]

## 2019-05-26 NOTE — Plan of Care (Signed)

## 2019-05-26 NOTE — Discharge Summary (Signed)
Physician Discharge Summary  Patient ID: Jonathan Daniels. MRN: [id number] DOB/AGE: 05/26/1936 83 y.o.  Admit date: 05/25/2019 Discharge date: 05/26/2019  Admission Diagnoses:  Primary localized osteoarthritis of right knee  Discharge Diagnoses:  Principal Problem:   Primary localized osteoarthritis of right knee Active Problems:   S/P TKR (total knee replacement), right   Past Medical History:  Diagnosis Date  . Arthritis    "right knee" (05/28/2018)  . BPH (benign prostatic hyperplasia)    but not on any meds  . CVA (cerebral vascular accident) ([place]) 05/28/2018   "left mouth droopy" (05/28/2018)   no longer present 05-18-19  . GERD (gastroesophageal reflux disease)   . Glaucoma    uses eye drops daily  . History of loop recorder    present at preop 05-18-19  . Hyperlipidemia    was on med but has been off for a yr-medical md is aware  . Hypertension    was on med but has been off a yr-medical md is aware  . Joint swelling   . Primary localized osteoarthritis of right knee 05/25/2019  . Primary osteoarthritis of left hip 05/27/2013  . Type II diabetes mellitus ([place])    only takes Cinnamon  . Urinary frequency     Surgeries: Procedure(s): TOTAL KNEE ARTHROPLASTY on 05/25/2019   Consultants (if any):   Discharged Condition: Improved  Hospital Course: Jonathan Daniels. is an 83 y.o. male who was admitted 05/25/2019 with a diagnosis of Primary localized osteoarthritis of right knee and went to the operating room on 05/25/2019 and underwent the above named procedures.    He was given perioperative antibiotics:  Anti-infectives (From admission, onward)   Start     Dose/Rate Route Frequency Ordered Stop   05/25/19 1400  ceFAZolin (ANCEF) IVPB 2g/100 mL premix     2 g 200 mL/hr over 30 Minutes Intravenous Every 6 hours 05/25/19 1256 05/25/19 2108   05/25/19 0600  ceFAZolin (ANCEF) IVPB 2g/100 mL premix     2 g 200 mL/hr over 30 Minutes Intravenous On call to O.R. 05/25/19 4742  05/25/19 0737    .  He was given sequential compression devices, early ambulation, and aspirin for DVT prophylaxis.  He benefited maximally from the hospital stay and there were no complications.    Recent vital signs:  Vitals:   05/26/19 0417 05/26/19 0932  BP: 115/70 (!) 141/73  Pulse: 71 73  Resp: 16 15  Temp: 98 F (36.7 C) 98.4 F (36.9 C)  SpO2: 99% 98%    Recent laboratory studies:  Lab Results  Component Value Date   HGB 10.4 (L) 05/26/2019   HGB 12.5 (L) 05/18/2019   HGB 11.7 (L) 05/31/2018   Lab Results  Component Value Date   WBC 9.4 05/26/2019   PLT 153 05/26/2019   Lab Results  Component Value Date   INR 1.07 05/28/2018   Lab Results  Component Value Date   NA 140 05/26/2019   K 4.0 05/26/2019   CL 109 05/26/2019   CO2 24 05/26/2019   BUN 12 05/26/2019   CREATININE 1.04 05/26/2019   GLUCOSE 132 (H) 05/26/2019    Discharge Medications:   Allergies as of 05/26/2019   No Known Allergies     Medication List    STOP taking these medications   aspirin EC 81 MG tablet   clopidogrel 75 MG tablet Commonly known as: PLAVIX     TAKE these medications   baclofen 10 MG tablet Commonly  known as: LIORESAL Take 1 tablet (10 mg total) by mouth 3 (three) times daily. As needed for muscle spasm   Brinzolamide-Brimonidine 1-0.2 % Susp Place 1 drop into both eyes daily.   Cinnamon 500 MG capsule Take 500 mg by mouth daily.   HYDROcodone-acetaminophen 10-325 MG tablet Commonly known as: Norco Take 1 tablet by mouth every 6 (six) hours as needed.   latanoprost 0.005 % ophthalmic solution Commonly known as: XALATAN Place 1 drop into both eyes at bedtime.   lisinopril 5 MG tablet Commonly known as: ZESTRIL Take 5 mg by mouth daily.   multivitamin with minerals Tabs tablet Take 1 tablet by mouth daily.   ondansetron 4 MG tablet Commonly known as: Zofran Take 1 tablet (4 mg total) by mouth every 8 (eight) hours as needed for nausea or  vomiting.   pravastatin 10 MG tablet Commonly known as: PRAVACHOL Take 10 mg by mouth at bedtime.   rivaroxaban 10 MG Tabs tablet Commonly known as: Xarelto Take 1 tablet (10 mg total) by mouth daily.   sennosides-docusate sodium 8.6-50 MG tablet Commonly known as: SENOKOT-S Take 2 tablets by mouth daily.   vitamin B-12 1000 MCG tablet Commonly known as: CYANOCOBALAMIN Take 1,000 mcg by mouth daily.       Diagnostic Studies: Dg Knee Right Port  Result Date: 05/25/2019 CLINICAL DATA:  Postop RIGHT total knee. EXAM: PORTABLE RIGHT KNEE - 1-2 VIEW COMPARISON:  None. FINDINGS: Total knee arthroplasty hardware appears intact and appropriately positioned. Osseous alignment is anatomic. No osseous fracture line or displaced fracture fragment seen. Expected joint effusion and postsurgical changes within the surrounding soft tissues. IMPRESSION: Status post RIGHT total knee arthroplasty. Hardware appears intact and appropriately positioned. No evidence of surgical complicating feature. Electronically Signed   By: Bary RichardStan  Maynard M.D.   On: 05/25/2019 11:03    Disposition:     Follow-up Information    Teryl LucyLandau, Hulbert Branscome, MD. Go on 06/07/2019.   Specialty: Orthopedic Surgery Why: Your appointment has been scheduled for 10:30. Contact information: [address] [place] [address] [phone]        [facility] Follow up.   Specialty: Home Health Services Why: you will be seen by HHPT at home for 5 visits prior to starting outpatient physical therapy  Contact information: [address] [place] [address] [phone]        [facility] [place] Go on 06/07/2019.   Why: You are scheduled to start outpatient physical therapy at 9:30. Please arrive at 9:00 to complete your paperwork. You will go straight over to see Dr. Dion SaucierLandau after your therapy session.  Contact information: [facility] [address] [place] [address] [phone]            Signed: Eulas PostJoshua P Cyruss Arata 05/26/2019, 11:01 AM

## 2019-05-26 NOTE — Progress Notes (Addendum)
Patient ID: Jonathan Daniels., male   DOB: [birth year], 83 y.o.   MRN: [id number]     Subjective:  Patient reports pain as mild.  Patient in bed and in no acute distress.    Objective:   VITALS:   Vitals:   05/25/19 1543 05/25/19 2022 05/26/19 0013 05/26/19 0417  BP: 135/75 128/74 (!) 115/56 115/70  Pulse: 77 78 72 71  Resp: 17 16 16 16   Temp: 97.8 F (36.6 C) 98 F (36.7 C) 98.4 F (36.9 C) 98 F (36.7 C)  TempSrc: Oral Oral Oral Oral  SpO2: 99% 98% 100% 99%  Weight:      Height:        ABD soft Sensation intact distally Dorsiflexion/Plantar flexion intact Incision: dressing C/D/I, no drainage and moderate drainage Dressing changed and wound looks good  Lab Results  Component Value Date   WBC 9.4 05/26/2019   HGB 10.4 (L) 05/26/2019   HCT 33.3 (L) 05/26/2019   MCV 91.0 05/26/2019   PLT 153 05/26/2019   BMET    Component Value Date/Time   NA 140 05/26/2019 0302   K 4.0 05/26/2019 0302   CL 109 05/26/2019 0302   CO2 24 05/26/2019 0302   GLUCOSE 132 (H) 05/26/2019 0302   BUN 12 05/26/2019 0302   CREATININE 1.04 05/26/2019 0302   CALCIUM 8.6 (L) 05/26/2019 0302   GFRNONAA >60 05/26/2019 0302   GFRAA >60 05/26/2019 0302     Assessment/Plan: 1 Day Post-Op   Principal Problem:   Primary localized osteoarthritis of right knee Active Problems:   S/P TKR (total knee replacement), right   Advance diet Up with therapy Discharge home with home health WBAT Dry dressing PRN    Patient's anticipated LOS is less than 2 midnights, meeting these requirements: - Younger than 64 - Lives within 1 hour of care - Has a competent adult at home to recover with post-op recover - NO history of  - Chronic pain requiring opiods  - Diabetes  - Coronary Artery Disease  - Heart failure  - Heart attack  - Stroke  - DVT/VTE  - Cardiac arrhythmia  - Respiratory Failure/COPD  - Renal failure  - Anemia  - Advanced Liver disease        Lunette Stands  05/26/2019, 8:22 AM  Discussed and agree with above.    Marchia Bond, MD Cell [phone]

## 2019-05-26 NOTE — Progress Notes (Signed)
Physical Therapy Treatment Patient Details Name: Jonathan FearingJames N Grenz Jr. MRN: [id number] DOB: 06/04/1936 Today's Date: 05/26/2019    History of Present Illness 83 yo male s/p R TKR on 05/25/19. PMH includes OA, CVA, GERD, glaucoma, HTN, HLD, biateral patellar tendon repair, R RTC repair, L THA.    PT Comments    POD # 1 pm session Assisted OOB.  Demonstrated and instructed pt how to use belt to self assist LE as a leg lifter Amb in hallway an increased distance.  Practiced stairs.  Then returned to room to perform some TE's following HEP handout.  Instructed on proper tech, freq as well as use of ICE.   Addressed all mobility questions, discussed appropriate activity, educated on use of ICE.  Pt ready for D/C to home.   Follow Up Recommendations  Follow surgeon's recommendation for DC plan and follow-up therapies;Supervision for mobility/OOB     Equipment Recommendations  None recommended by PT    Recommendations for Other Services       Precautions / Restrictions Precautions Precautions: Fall Restrictions Weight Bearing Restrictions: No Other Position/Activity Restrictions: WBAT    Mobility  Bed Mobility Overal bed mobility: Needs Assistance Bed Mobility: Supine to Sit     Supine to sit: Min assist;HOB elevated     General bed mobility comments: increased time  Transfers Overall transfer level: Needs assistance Equipment used: Rolling walker (2 wheeled) Transfers: Sit to/from Stand Sit to Stand: Min assist;From elevated surface;Min guard         General transfer comment: 25% VC's on proper hand placement and safety with turns  Ambulation/Gait Ambulation/Gait assistance: Min guard Gait Distance (Feet): 45 Feet Assistive device: Rolling walker (2 wheeled) Gait Pattern/deviations: Step-to pattern;Decreased step length - right;Decreased step length - left;Antalgic;Decreased weight shift to right Gait velocity: decr   General Gait Details: 25% VC's on proper walker  to self distance and upright posture   Stairs Stairs: Yes Stairs assistance: Min guard;Min assist Stair Management: Two rails;Forwards Number of Stairs: 2 General stair comments: 50% VC's on proper sequencing and safety   Wheelchair Mobility    Modified Rankin (Stroke Patients Only)       Balance                                            Cognition Arousal/Alertness: Awake/alert   Overall Cognitive Status: Within Functional Limits for tasks assessed                                        Exercises      General Comments        Pertinent Vitals/Pain Pain Assessment: 0-10 Pain Score: 3  Pain Location: R knee Pain Descriptors / Indicators: Sore Pain Intervention(s): Monitored during session;Premedicated before session;Repositioned;Ice applied    Home Living                      Prior Function            PT Goals (current goals can now be found in the care plan section)      Frequency    7X/week      PT Plan      Co-evaluation              AM-PAC  PT "6 Clicks" Mobility   Outcome Measure  Help needed turning from your back to your side while in a flat bed without using bedrails?: A Little Help needed moving from lying on your back to sitting on the side of a flat bed without using bedrails?: A Little Help needed moving to and from a bed to a chair (including a wheelchair)?: A Little Help needed standing up from a chair using your arms (e.g., wheelchair or bedside chair)?: A Little Help needed to walk in hospital room?: A Little Help needed climbing 3-5 steps with a railing? : A Lot 6 Click Score: 17    End of Session Equipment Utilized During Treatment: Gait belt Activity Tolerance: Patient tolerated treatment well;Patient limited by pain;Patient limited by fatigue Patient left: in chair;with call bell/phone within reach;with chair alarm set;with SCD's reapplied Nurse Communication: Mobility  status PT Visit Diagnosis: Difficulty in walking, not elsewhere classified (R26.2);Other abnormalities of gait and mobility (R26.89)     Time: 5852-7782 PT Time Calculation (min) (ACUTE ONLY): 27 min  Charges:  $Gait Training: 8-22 mins $Therapeutic Activity: 8-22 mins                     Rica Koyanagi  PTA Acute  Rehabilitation Services Pager      [phone] Office      [phone]

## 2019-05-31 ENCOUNTER — Ambulatory Visit (INDEPENDENT_AMBULATORY_CARE_PROVIDER_SITE_OTHER): Payer: Medicare Other | Admitting: *Deleted

## 2019-05-31 DIAGNOSIS — I639 Cerebral infarction, unspecified: Secondary | ICD-10-CM

## 2019-05-31 LAB — CUP PACEART REMOTE DEVICE CHECK
Date Time Interrogation Session: 20200720023550
Implantable Pulse Generator Implant Date: 20190722

## 2019-06-02 NOTE — Telephone Encounter (Signed)
Monitor shipped 05/25/19 per [facility].

## 2019-06-03 NOTE — Telephone Encounter (Signed)
Spoke w/ pt wife and she stated that pt has received the monitor. She stated that pt had knee surgery and is resting now and hasn't put the monitor together yet. Provided pt wife w/ direct number to [facility] and they will call back when they are ready to send transmission.

## 2019-06-08 NOTE — Telephone Encounter (Signed)
LMOVM for pt to send transmission. I left my direct office number for the pt to call if he has any questions.

## 2019-06-11 NOTE — Telephone Encounter (Signed)
LMOVM

## 2019-06-14 NOTE — Telephone Encounter (Signed)
Spoke w/ pt wife and requested that they send a manual transmission w/ the home monitor. Transmission received.

## 2019-06-14 NOTE — Progress Notes (Signed)
[facility] Summary Report / Loop Recorder 

## 2019-06-15 NOTE — Telephone Encounter (Signed)
Transmission reviewed. Available ECGs suggest SR w/ ectopy. Will continue to monitor.

## 2019-07-02 ENCOUNTER — Ambulatory Visit (INDEPENDENT_AMBULATORY_CARE_PROVIDER_SITE_OTHER): Payer: Medicare Other | Admitting: *Deleted

## 2019-07-02 DIAGNOSIS — I639 Cerebral infarction, unspecified: Secondary | ICD-10-CM | POA: Diagnosis not present

## 2019-07-04 LAB — CUP PACEART REMOTE DEVICE CHECK
Date Time Interrogation Session: 20200822033900
Implantable Pulse Generator Implant Date: 20190722

## 2019-07-09 NOTE — Progress Notes (Signed)
[facility] Summary Report / Loop Recorder;sr  

## 2019-07-21 ENCOUNTER — Telehealth: Payer: Self-pay | Admitting: Emergency Medicine

## 2019-07-21 NOTE — Telephone Encounter (Signed)
LMOM.  Need remote transmission set from [facility] to view all events.  One visible EGM shows SVT with PVC's . No EGMs available for events listed as AF.

## 2019-07-26 NOTE — Telephone Encounter (Signed)
LMOVM for pt to send a transmission with his home monitor. I left my direct office number for the pt to call if he has questions.

## 2019-07-27 NOTE — Telephone Encounter (Signed)
LMOVM for pt to send transmission with his home monitor. 

## 2019-07-28 NOTE — Telephone Encounter (Signed)
This is 4th attempt to get the pt to send a manual transmission with his home monitor. LMOVM

## 2019-07-29 NOTE — Telephone Encounter (Signed)
Letter sent 07-29-2019

## 2019-07-29 NOTE — Progress Notes (Signed)
Letter  

## 2019-08-04 ENCOUNTER — Ambulatory Visit (INDEPENDENT_AMBULATORY_CARE_PROVIDER_SITE_OTHER): Payer: Medicare Other | Admitting: *Deleted

## 2019-08-04 DIAGNOSIS — I639 Cerebral infarction, unspecified: Secondary | ICD-10-CM | POA: Diagnosis not present

## 2019-08-05 LAB — CUP PACEART REMOTE DEVICE CHECK
Date Time Interrogation Session: 20200924094400
Implantable Pulse Generator Implant Date: 20190722

## 2019-08-10 NOTE — Progress Notes (Signed)
[facility] Summary Report / Loop Recorder 

## 2019-08-10 NOTE — Telephone Encounter (Signed)
CERTIFIED LETTER SENT

## 2019-09-06 ENCOUNTER — Telehealth: Payer: Self-pay

## 2019-09-06 NOTE — Telephone Encounter (Signed)
Reviewed transmission.  Available EGMs appear to show SVT/ST with ectopy.  Last episode 9/3, so would not change programming at this time.   Wife had no questions.   Legrand Como [address] [place], PA-C  09/06/2019 3:17 PM

## 2019-09-06 NOTE — Telephone Encounter (Signed)
Pt wife sent the manual transmission we was waiting on from 07-21-2019. I told her I will have someone review his transmission and give them a call back.

## 2019-09-07 ENCOUNTER — Ambulatory Visit (INDEPENDENT_AMBULATORY_CARE_PROVIDER_SITE_OTHER): Payer: Medicare Other | Admitting: *Deleted

## 2019-09-07 DIAGNOSIS — I6300 Cerebral infarction due to thrombosis of unspecified precerebral artery: Secondary | ICD-10-CM

## 2019-09-08 LAB — CUP PACEART REMOTE DEVICE CHECK
Date Time Interrogation Session: 20201027120932
Implantable Pulse Generator Implant Date: 20190722

## 2019-09-27 NOTE — Progress Notes (Signed)
[facility] Summary Report / Loop Recorder 

## 2019-10-10 LAB — CUP PACEART REMOTE DEVICE CHECK
Date Time Interrogation Session: 20201129102227
Implantable Pulse Generator Implant Date: 20190722

## 2019-10-11 ENCOUNTER — Ambulatory Visit (INDEPENDENT_AMBULATORY_CARE_PROVIDER_SITE_OTHER): Payer: Medicare Other | Admitting: *Deleted

## 2019-10-11 DIAGNOSIS — I6300 Cerebral infarction due to thrombosis of unspecified precerebral artery: Secondary | ICD-10-CM

## 2019-11-03 NOTE — Progress Notes (Signed)
ILR remote 

## 2019-11-11 ENCOUNTER — Ambulatory Visit (INDEPENDENT_AMBULATORY_CARE_PROVIDER_SITE_OTHER): Payer: Medicare Other | Admitting: *Deleted

## 2019-11-11 DIAGNOSIS — I6300 Cerebral infarction due to thrombosis of unspecified precerebral artery: Secondary | ICD-10-CM | POA: Diagnosis not present

## 2019-11-11 LAB — CUP PACEART REMOTE DEVICE CHECK
Date Time Interrogation Session: 20201230231905
Implantable Pulse Generator Implant Date: 20190722

## 2019-12-13 ENCOUNTER — Ambulatory Visit (INDEPENDENT_AMBULATORY_CARE_PROVIDER_SITE_OTHER): Payer: Medicare Other | Admitting: *Deleted

## 2019-12-13 DIAGNOSIS — I6300 Cerebral infarction due to thrombosis of unspecified precerebral artery: Secondary | ICD-10-CM

## 2019-12-13 LAB — CUP PACEART REMOTE DEVICE CHECK
Date Time Interrogation Session: 20210201001553
Implantable Pulse Generator Implant Date: 20190722

## 2019-12-13 NOTE — Progress Notes (Signed)
ILR Remote 

## 2020-01-11 IMAGING — DX PORTABLE RIGHT KNEE - 1-2 VIEW
2 series · 2 of 2 positions shown · non-contrast
Comparison: None.

CLINICAL DATA: Postop RIGHT total knee.

EXAM:
PORTABLE RIGHT KNEE - 1-2 VIEW

[knee ap]
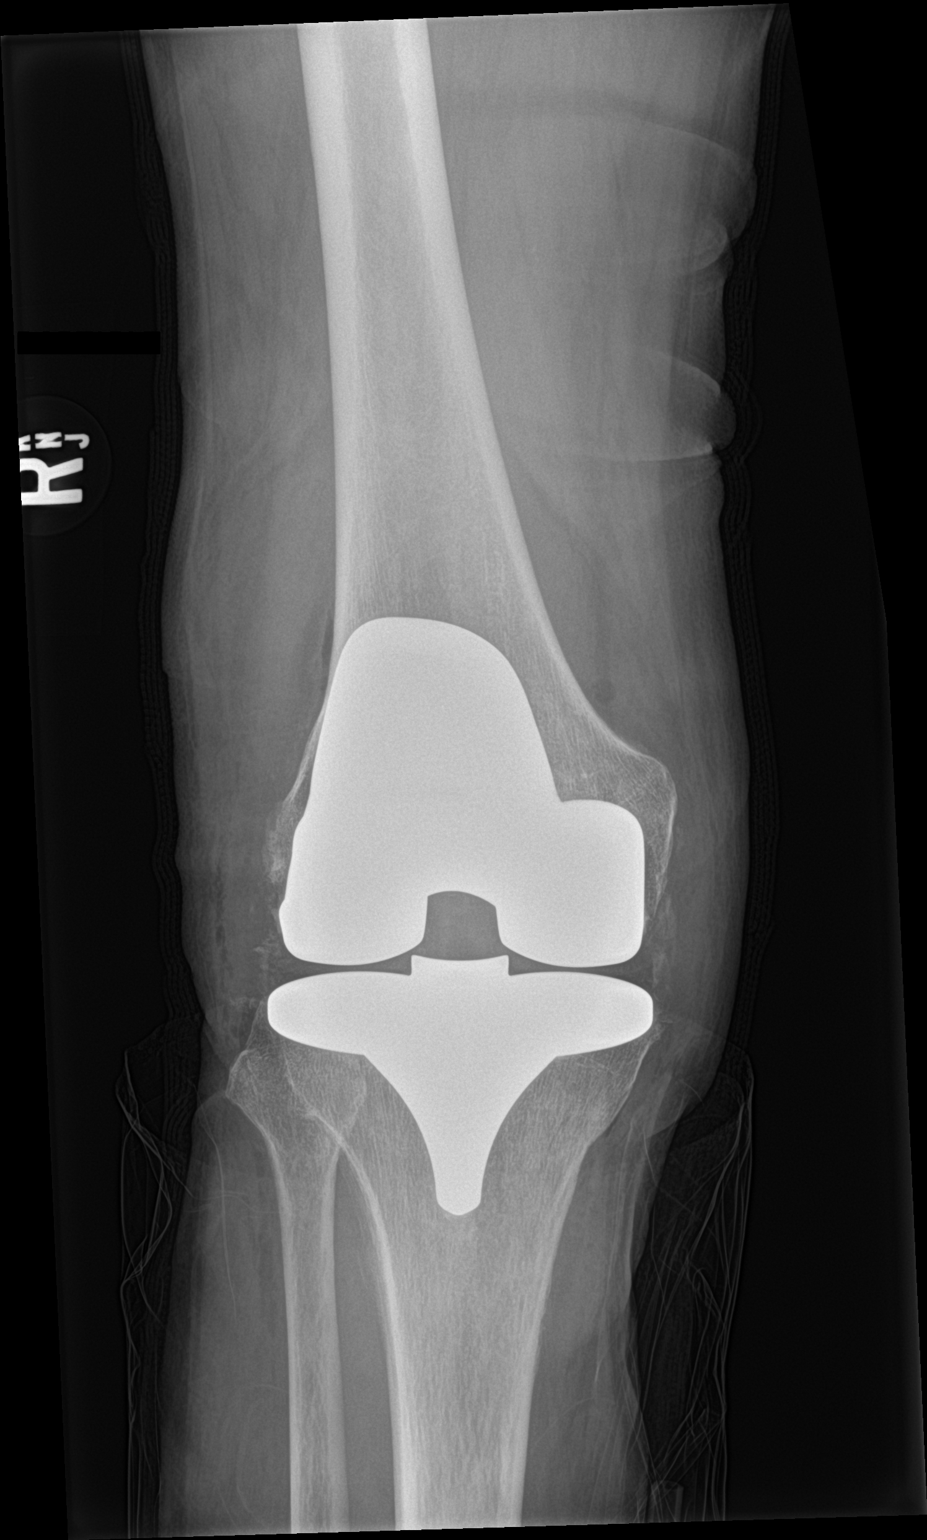

[knee lat]
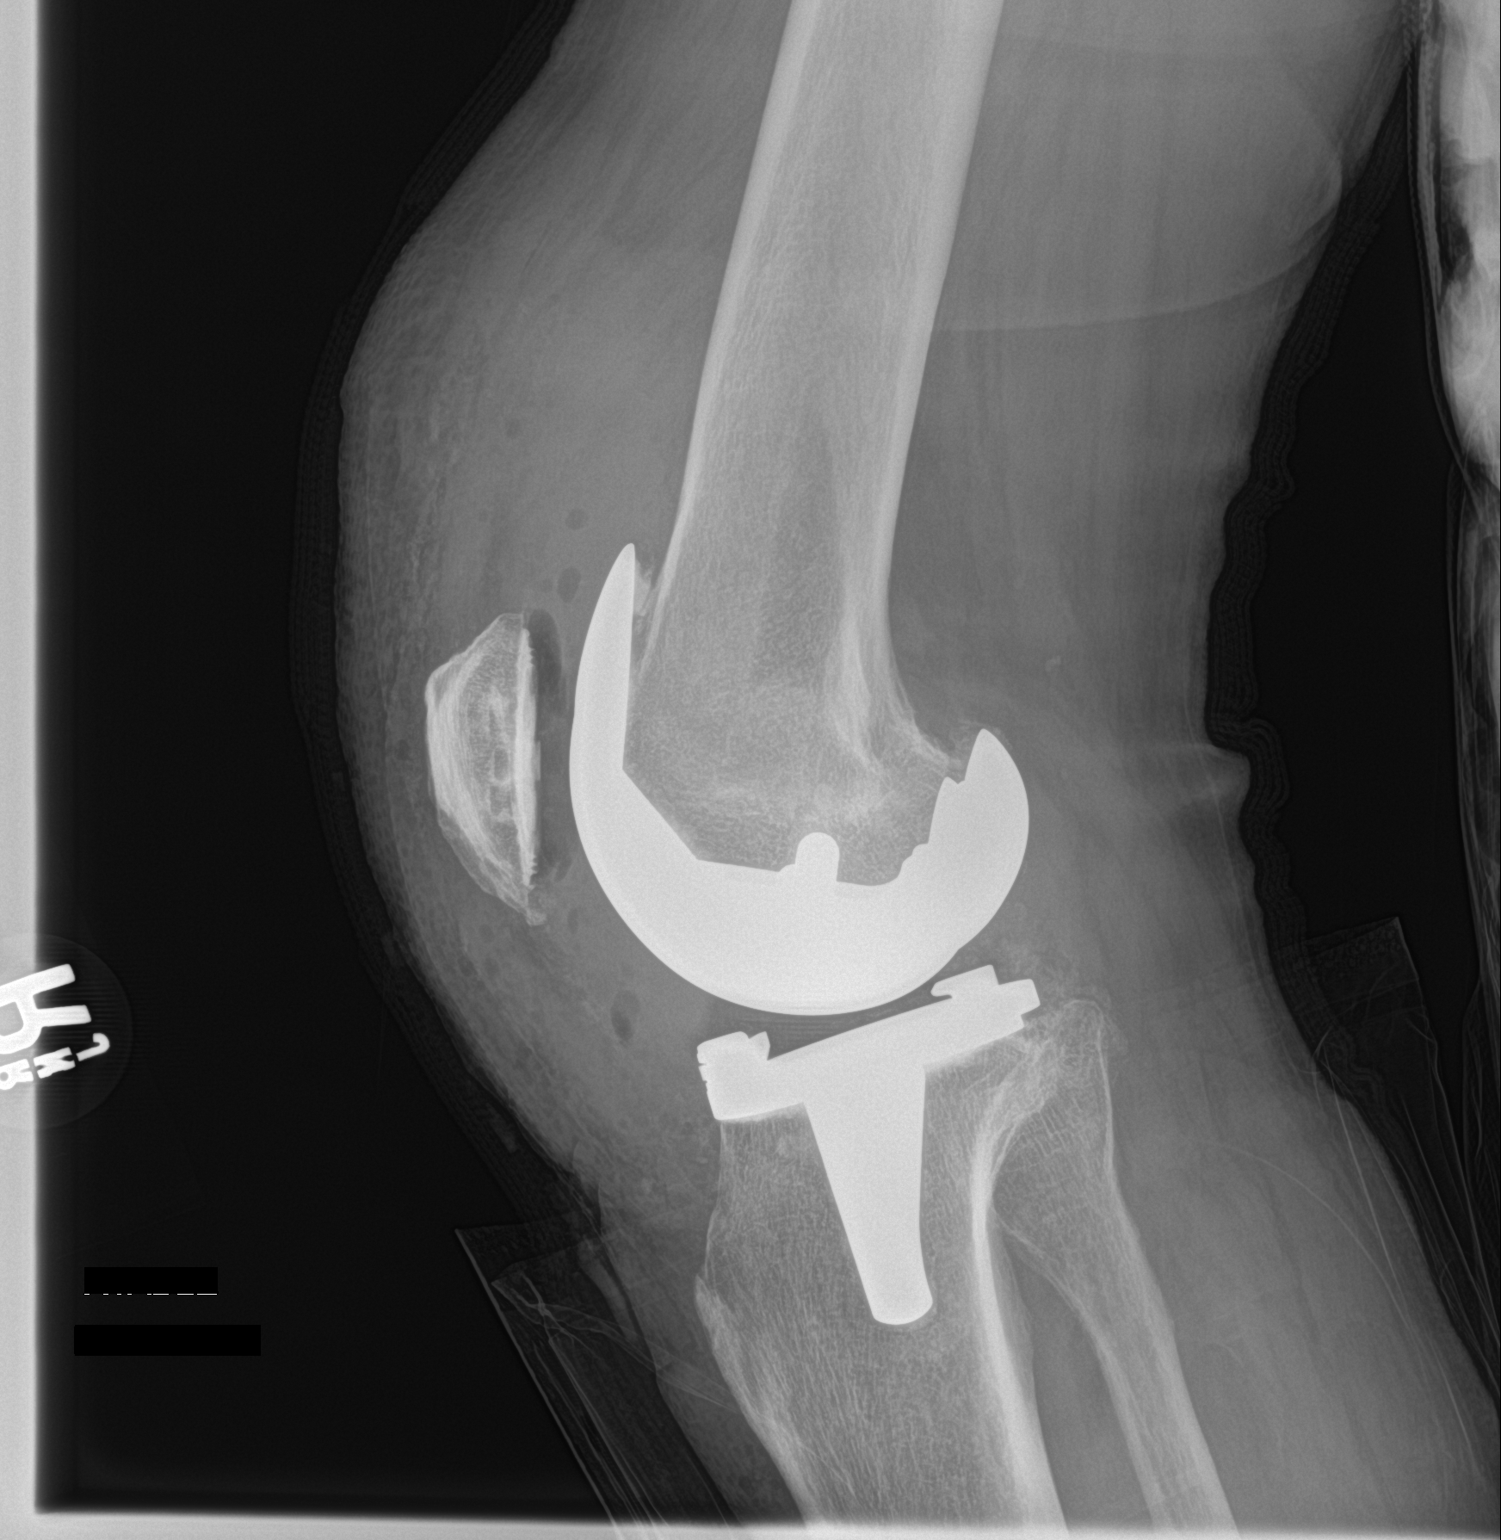

[2 of 2 positions shown; findings below may reference images not displayed]

FINDINGS: Total knee arthroplasty hardware appears intact and appropriately
positioned. Osseous alignment is anatomic. No osseous fracture line
or displaced fracture fragment seen. Expected joint effusion and
postsurgical changes within the surrounding soft tissues.
IMPRESSION: Status post RIGHT total knee arthroplasty. Hardware appears intact
and appropriately positioned. No evidence of surgical complicating
feature.

## 2020-01-13 ENCOUNTER — Ambulatory Visit (INDEPENDENT_AMBULATORY_CARE_PROVIDER_SITE_OTHER): Payer: Medicare Other | Admitting: *Deleted

## 2020-01-13 DIAGNOSIS — I6300 Cerebral infarction due to thrombosis of unspecified precerebral artery: Secondary | ICD-10-CM | POA: Diagnosis not present

## 2020-01-13 LAB — CUP PACEART REMOTE DEVICE CHECK
Date Time Interrogation Session: 20210304025202
Implantable Pulse Generator Implant Date: 20190722

## 2020-01-13 NOTE — Progress Notes (Signed)
ILR Remote 

## 2020-02-14 ENCOUNTER — Ambulatory Visit (INDEPENDENT_AMBULATORY_CARE_PROVIDER_SITE_OTHER): Payer: Medicare Other | Admitting: *Deleted

## 2020-02-14 DIAGNOSIS — I6300 Cerebral infarction due to thrombosis of unspecified precerebral artery: Secondary | ICD-10-CM

## 2020-02-14 LAB — CUP PACEART REMOTE DEVICE CHECK
Date Time Interrogation Session: 20210404035409
Implantable Pulse Generator Implant Date: 20190722

## 2020-02-15 NOTE — Progress Notes (Signed)
ILR Remote 

## 2020-03-16 LAB — CUP PACEART REMOTE DEVICE CHECK
Date Time Interrogation Session: 20210505035823
Implantable Pulse Generator Implant Date: 20190722

## 2020-03-20 ENCOUNTER — Ambulatory Visit (INDEPENDENT_AMBULATORY_CARE_PROVIDER_SITE_OTHER): Payer: Medicare Other | Admitting: *Deleted

## 2020-03-20 DIAGNOSIS — R471 Dysarthria and anarthria: Secondary | ICD-10-CM | POA: Diagnosis not present

## 2020-03-20 DIAGNOSIS — I639 Cerebral infarction, unspecified: Secondary | ICD-10-CM | POA: Diagnosis not present

## 2020-03-20 NOTE — Progress Notes (Signed)
[facility] Summary Report / Loop Recorder 

## 2020-04-24 ENCOUNTER — Ambulatory Visit (INDEPENDENT_AMBULATORY_CARE_PROVIDER_SITE_OTHER): Payer: Medicare Other | Admitting: *Deleted

## 2020-04-24 DIAGNOSIS — I6300 Cerebral infarction due to thrombosis of unspecified precerebral artery: Secondary | ICD-10-CM

## 2020-04-24 LAB — CUP PACEART REMOTE DEVICE CHECK
Date Time Interrogation Session: 20210613234104
Implantable Pulse Generator Implant Date: 20190722

## 2020-04-25 NOTE — Progress Notes (Signed)
[facility] Summary Report / Loop Recorder 

## 2020-05-29 ENCOUNTER — Ambulatory Visit (INDEPENDENT_AMBULATORY_CARE_PROVIDER_SITE_OTHER): Payer: Medicare Other | Admitting: *Deleted

## 2020-05-29 DIAGNOSIS — I6300 Cerebral infarction due to thrombosis of unspecified precerebral artery: Secondary | ICD-10-CM | POA: Diagnosis not present

## 2020-05-29 LAB — CUP PACEART REMOTE DEVICE CHECK
Date Time Interrogation Session: 20210718232241
Implantable Pulse Generator Implant Date: 20190722

## 2020-05-31 NOTE — Progress Notes (Signed)
[facility] Summary Report / Loop Recorder 

## 2020-07-02 LAB — CUP PACEART REMOTE DEVICE CHECK
Date Time Interrogation Session: 20210819001056
Implantable Pulse Generator Implant Date: 20190722

## 2020-07-03 ENCOUNTER — Ambulatory Visit (INDEPENDENT_AMBULATORY_CARE_PROVIDER_SITE_OTHER): Payer: Medicare Other | Admitting: *Deleted

## 2020-07-03 DIAGNOSIS — I6300 Cerebral infarction due to thrombosis of unspecified precerebral artery: Secondary | ICD-10-CM | POA: Diagnosis not present

## 2020-07-03 NOTE — Progress Notes (Signed)
[facility] Summary Report / Loop Recorder 

## 2020-08-07 ENCOUNTER — Ambulatory Visit (INDEPENDENT_AMBULATORY_CARE_PROVIDER_SITE_OTHER): Payer: Medicare Other | Admitting: Emergency Medicine

## 2020-08-07 DIAGNOSIS — I6300 Cerebral infarction due to thrombosis of unspecified precerebral artery: Secondary | ICD-10-CM

## 2020-08-07 LAB — CUP PACEART REMOTE DEVICE CHECK
Date Time Interrogation Session: 20210919002918
Implantable Pulse Generator Implant Date: 20190722

## 2020-08-09 NOTE — Progress Notes (Signed)
[facility] Summary Report / Loop Recorder 

## 2020-08-30 ENCOUNTER — Ambulatory Visit (INDEPENDENT_AMBULATORY_CARE_PROVIDER_SITE_OTHER): Payer: Medicare Other

## 2020-08-30 DIAGNOSIS — I6300 Cerebral infarction due to thrombosis of unspecified precerebral artery: Secondary | ICD-10-CM

## 2020-08-30 LAB — CUP PACEART REMOTE DEVICE CHECK
Date Time Interrogation Session: 20211020022248
Implantable Pulse Generator Implant Date: 20190722

## 2020-09-05 NOTE — Progress Notes (Signed)
[facility] Summary Report / Loop Recorder 

## 2020-09-19 DIAGNOSIS — I6300 Cerebral infarction due to thrombosis of unspecified precerebral artery: Secondary | ICD-10-CM

## 2020-09-30 LAB — CUP PACEART REMOTE DEVICE CHECK
Date Time Interrogation Session: 20211120013606
Implantable Pulse Generator Implant Date: 20190722

## 2020-10-02 ENCOUNTER — Ambulatory Visit (INDEPENDENT_AMBULATORY_CARE_PROVIDER_SITE_OTHER): Payer: Medicare Other

## 2020-10-02 DIAGNOSIS — I6300 Cerebral infarction due to thrombosis of unspecified precerebral artery: Secondary | ICD-10-CM | POA: Diagnosis not present

## 2020-10-03 NOTE — Progress Notes (Signed)
[facility] Summary Report / Loop Recorder 

## 2020-10-31 ENCOUNTER — Ambulatory Visit (INDEPENDENT_AMBULATORY_CARE_PROVIDER_SITE_OTHER): Payer: Medicare Other

## 2020-10-31 DIAGNOSIS — I6300 Cerebral infarction due to thrombosis of unspecified precerebral artery: Secondary | ICD-10-CM | POA: Diagnosis not present

## 2020-10-31 LAB — CUP PACEART REMOTE DEVICE CHECK
Date Time Interrogation Session: 20211221013744
Implantable Pulse Generator Implant Date: 20190722

## 2020-11-14 NOTE — Progress Notes (Signed)
[facility] Summary Report / Loop Recorder 

## 2020-12-01 ENCOUNTER — Ambulatory Visit (INDEPENDENT_AMBULATORY_CARE_PROVIDER_SITE_OTHER): Payer: Medicare Other

## 2020-12-01 DIAGNOSIS — I6300 Cerebral infarction due to thrombosis of unspecified precerebral artery: Secondary | ICD-10-CM | POA: Diagnosis not present

## 2020-12-01 LAB — CUP PACEART REMOTE DEVICE CHECK
Date Time Interrogation Session: 20220121014256
Implantable Pulse Generator Implant Date: 20190722

## 2020-12-13 NOTE — Progress Notes (Signed)
[facility] Summary Report / Loop Recorder 

## 2021-01-01 ENCOUNTER — Ambulatory Visit (INDEPENDENT_AMBULATORY_CARE_PROVIDER_SITE_OTHER): Payer: Medicare Other

## 2021-01-01 DIAGNOSIS — I6300 Cerebral infarction due to thrombosis of unspecified precerebral artery: Secondary | ICD-10-CM

## 2021-01-01 LAB — CUP PACEART REMOTE DEVICE CHECK
Date Time Interrogation Session: 20220221025713
Implantable Pulse Generator Implant Date: 20190722

## 2021-01-05 NOTE — Progress Notes (Signed)
[facility] Summary Report / Loop Recorder 

## 2021-02-01 ENCOUNTER — Ambulatory Visit (INDEPENDENT_AMBULATORY_CARE_PROVIDER_SITE_OTHER): Payer: Medicare Other

## 2021-02-01 DIAGNOSIS — I6300 Cerebral infarction due to thrombosis of unspecified precerebral artery: Secondary | ICD-10-CM

## 2021-02-01 LAB — CUP PACEART REMOTE DEVICE CHECK
Date Time Interrogation Session: 20220324035707
Implantable Pulse Generator Implant Date: 20190722

## 2021-02-12 NOTE — Progress Notes (Signed)
[facility] Summary Report / Loop Recorder 

## 2021-02-26 ENCOUNTER — Encounter: Payer: Self-pay | Admitting: Gastroenterology

## 2021-03-05 ENCOUNTER — Ambulatory Visit (INDEPENDENT_AMBULATORY_CARE_PROVIDER_SITE_OTHER): Payer: Medicare Other

## 2021-03-05 DIAGNOSIS — I6300 Cerebral infarction due to thrombosis of unspecified precerebral artery: Secondary | ICD-10-CM | POA: Diagnosis not present

## 2021-03-05 LAB — CUP PACEART REMOTE DEVICE CHECK
Date Time Interrogation Session: 20220424041045
Implantable Pulse Generator Implant Date: 20190722

## 2021-03-22 NOTE — Progress Notes (Signed)
[facility] Summary Report / Loop Recorder 

## 2021-04-05 ENCOUNTER — Ambulatory Visit (INDEPENDENT_AMBULATORY_CARE_PROVIDER_SITE_OTHER): Payer: Medicare Other

## 2021-04-05 DIAGNOSIS — I639 Cerebral infarction, unspecified: Secondary | ICD-10-CM | POA: Diagnosis not present

## 2021-04-05 LAB — CUP PACEART REMOTE DEVICE CHECK
Date Time Interrogation Session: 20220525041232
Implantable Pulse Generator Implant Date: 20190722

## 2021-04-30 NOTE — Progress Notes (Signed)
[facility] Summary Report / Loop Recorder 

## 2021-05-05 LAB — CUP PACEART REMOTE DEVICE CHECK
Date Time Interrogation Session: 20220625041548
Implantable Pulse Generator Implant Date: 20190722

## 2021-05-07 ENCOUNTER — Ambulatory Visit (INDEPENDENT_AMBULATORY_CARE_PROVIDER_SITE_OTHER): Payer: Medicare Other

## 2021-05-07 DIAGNOSIS — I639 Cerebral infarction, unspecified: Secondary | ICD-10-CM | POA: Diagnosis not present

## 2021-05-24 NOTE — Progress Notes (Signed)
[facility] Summary Report / Loop Recorder 

## 2021-06-07 ENCOUNTER — Ambulatory Visit (INDEPENDENT_AMBULATORY_CARE_PROVIDER_SITE_OTHER): Payer: Medicare Other

## 2021-06-07 DIAGNOSIS — I639 Cerebral infarction, unspecified: Secondary | ICD-10-CM | POA: Diagnosis not present

## 2021-06-07 LAB — CUP PACEART REMOTE DEVICE CHECK
Date Time Interrogation Session: 20220726042518
Implantable Pulse Generator Implant Date: 20190722

## 2021-07-03 NOTE — Progress Notes (Signed)
[facility] Summary Report / Loop Recorder 

## 2021-07-09 ENCOUNTER — Ambulatory Visit (INDEPENDENT_AMBULATORY_CARE_PROVIDER_SITE_OTHER): Payer: Medicare Other

## 2021-07-09 DIAGNOSIS — I6300 Cerebral infarction due to thrombosis of unspecified precerebral artery: Secondary | ICD-10-CM

## 2021-07-09 LAB — CUP PACEART REMOTE DEVICE CHECK
Date Time Interrogation Session: 20220826043448
Implantable Pulse Generator Implant Date: 20190722

## 2021-07-14 ENCOUNTER — Other Ambulatory Visit: Payer: Self-pay

## 2021-07-14 ENCOUNTER — Emergency Department (HOSPITAL_COMMUNITY)
Admission: EM | Admit: 2021-07-14 | Discharge: 2021-07-15 | Disposition: A | Discharge status: Home / Self Care | Payer: Medicare Other | Attending: Emergency Medicine | Admitting: Emergency Medicine

## 2021-07-14 DIAGNOSIS — Z79899 Other long term (current) drug therapy: Secondary | ICD-10-CM | POA: Insufficient documentation

## 2021-07-14 DIAGNOSIS — S61011A Laceration without foreign body of right thumb without damage to nail, initial encounter: Secondary | ICD-10-CM

## 2021-07-14 DIAGNOSIS — I10 Essential (primary) hypertension: Secondary | ICD-10-CM | POA: Diagnosis not present

## 2021-07-14 DIAGNOSIS — Z23 Encounter for immunization: Secondary | ICD-10-CM | POA: Insufficient documentation

## 2021-07-14 DIAGNOSIS — F0390 Unspecified dementia without behavioral disturbance: Secondary | ICD-10-CM | POA: Diagnosis not present

## 2021-07-14 DIAGNOSIS — Z96642 Presence of left artificial hip joint: Secondary | ICD-10-CM | POA: Insufficient documentation

## 2021-07-14 DIAGNOSIS — Z7901 Long term (current) use of anticoagulants: Secondary | ICD-10-CM | POA: Insufficient documentation

## 2021-07-14 DIAGNOSIS — Z96651 Presence of right artificial knee joint: Secondary | ICD-10-CM | POA: Insufficient documentation

## 2021-07-14 DIAGNOSIS — E119 Type 2 diabetes mellitus without complications: Secondary | ICD-10-CM | POA: Diagnosis not present

## 2021-07-14 DIAGNOSIS — W312XXA Contact with powered woodworking and forming machines, initial encounter: Secondary | ICD-10-CM | POA: Insufficient documentation

## 2021-07-14 DIAGNOSIS — S6991XA Unspecified injury of right wrist, hand and finger(s), initial encounter: Secondary | ICD-10-CM | POA: Diagnosis present

## 2021-07-14 NOTE — ED Triage Notes (Signed)
Pt reports cutting right thumb on circular saw. Dressing placed, bleeding controlled at this time.

## 2021-07-14 NOTE — ED Provider Notes (Signed)
Emergency Medicine Provider Triage Evaluation Note  Jonathan Daniels. , a 85 y.o. male  was evaluated in triage.  Pt complains of thumb lac.  Review of Systems  Positive: R thumb lac Negative: numbness  Physical Exam  There were no vitals taken for this visit. Gen:   Awake, no distress   Resp:  Normal effort  MSK:   Moves extremities without difficulty  Other:  R thumb: partial skin avulsion to pad of thumb, actively bleeding  Medical Decision Making  Medically screening exam initiated at 6:26 PM.  Appropriate orders placed.  Jonathan Daniels. was informed that the remainder of the evaluation will be completed by another provider, this initial triage assessment does not replace that evaluation, and the importance of remaining in the ED until their evaluation is complete.  Pt accidentally cut into his R thumb from a table saw today.  Not on blood thinner.  Is UTD with tdap.    Jonathan Helper, PA-C 07/14/21 1827    Derwood Kaplan, MD 07/15/21 [phone]

## 2021-07-15 DIAGNOSIS — S61011A Laceration without foreign body of right thumb without damage to nail, initial encounter: Secondary | ICD-10-CM | POA: Diagnosis not present

## 2021-07-15 MED ORDER — POVIDONE-IODINE 5 % EX SOLN: Freq: Once | CUTANEOUS | Status: DC

## 2021-07-15 MED ORDER — TETANUS-DIPHTH-ACELL PERTUSSIS 5-2.5-18.5 LF-MCG/0.5 IM SUSY
0.5000 mL | PREFILLED_SYRINGE | Freq: Once | INTRAMUSCULAR | Status: AC
  Administered 2021-07-15: 0.5 mL via INTRAMUSCULAR
  Filled 2021-07-15: qty 0.5

## 2021-07-15 MED ORDER — "THROMBI-PAD 3""X3"" EX PADS"
1.0000 | MEDICATED_PAD | Freq: Once | CUTANEOUS | Status: AC
  Administered 2021-07-15: 1 via TOPICAL
  Filled 2021-07-15: qty 1

## 2021-07-15 NOTE — ED Provider Notes (Signed)
[facility] EMERGENCY DEPARTMENT Provider Note   CSN: [id number] Arrival date & time: 07/14/21  1819     History Chief Complaint  Patient presents with   Laceration    Jonathan Daniels. is a 85 y.o. male.  Patient to ED with right thumb injury while using a table saw. No other injury. He states he came because he could not stop the bleeding.   The history is provided by the patient and the spouse. No language interpreter was used.  Laceration     Past Medical History:  Diagnosis Date   Arthritis    "right knee" (05/28/2018)   BPH (benign prostatic hyperplasia)    but not on any meds   CVA (cerebral vascular accident) (HCC) 05/28/2018   "left mouth droopy" (05/28/2018)   no longer present 05-18-19   GERD (gastroesophageal reflux disease)    Glaucoma    uses eye drops daily   History of loop recorder    present at preop 05-18-19   Hyperlipidemia    was on med but has been off for a yr-medical md is aware   Hypertension    was on med but has been off a yr-medical md is aware   Joint swelling    Primary localized osteoarthritis of right knee 05/25/2019   Primary osteoarthritis of left hip 05/27/2013   Type II diabetes mellitus (HCC)    only takes Cinnamon   Urinary frequency     Patient Active Problem List   Diagnosis Date Noted   S/P total knee arthroplasty 05/26/2019   Primary localized osteoarthritis of right knee 05/25/2019   S/P TKR (total knee replacement), right 05/25/2019   Stroke (HCC) 05/28/2018   Dysarthria due to acute cerebrovascular accident (CVA) (HCC) 05/28/2018   Facial droop due to acute cerebrovascular accident (CVA) (HCC) 05/28/2018   Hip arthritis 02/08/2014   Primary osteoarthritis of left hip 05/27/2013   Dementia arising in the senium and presenium (HCC) 05/27/2013   Diabetes mellitus type II, controlled (HCC) 12/04/2011   Pure hypercholesterolemia 10/30/2011   Glaucoma 10/30/2011   PSA, INCREASED 11/26/2010   Essential  hypertension 09/24/2010   GERD 09/24/2010   HYPERTROPHY PROSTATE W/UR OBST & OTH LUTS 09/24/2010    Past Surgical History:  Procedure Laterality Date   CARDIAC CATHETERIZATION  2011   COLONOSCOPY     JOINT REPLACEMENT     LOOP RECORDER INSERTION N/A 06/01/2018   Procedure: LOOP RECORDER INSERTION;  Surgeon: Duke Salvia, MD;  Location: [facility] INVASIVE CV LAB;  Service: Cardiovascular;  Laterality: N/A;   PATELLAR TENDON REPAIR Bilateral    SHOULDER OPEN ROTATOR CUFF REPAIR Right    TEE WITHOUT CARDIOVERSION N/A 06/01/2018   Procedure: TRANSESOPHAGEAL ECHOCARDIOGRAM (TEE);  Surgeon: Chilton Si, MD;  Location: [facility] ENDOSCOPY;  Service: Cardiovascular;  Laterality: N/A;   TOTAL HIP ARTHROPLASTY Left 02/08/2014   Procedure: TOTAL HIP ARTHROPLASTY;  Surgeon: Eulas Post, MD;  Location: [facility] OR;  Service: Orthopedics;  Laterality: Left;   TOTAL KNEE ARTHROPLASTY Right 05/25/2019   Procedure: TOTAL KNEE ARTHROPLASTY;  Surgeon: Teryl Lucy, MD;  Location: [facility] ORS;  Service: Orthopedics;  Laterality: Right;       Family History  Problem Relation Age of Onset   Kidney disease Other    Diabetes Father    Cancer Neg Hx    Early death Neg Hx    Heart disease Neg Hx    Hypertension Neg Hx    Hyperlipidemia Neg Hx  Stroke Neg Hx     Social History   Tobacco Use   Smoking status: Never   Smokeless tobacco: Never  Vaping Use   Vaping Use: Never used  Substance Use Topics   Alcohol use: Not Currently    Comment: 05/28/2018 "couple times/year"   Drug use: Never    Home Medications Prior to Admission medications   Medication Sig Start Date End Date Taking? Authorizing Provider  baclofen (LIORESAL) 10 MG tablet Take 1 tablet (10 mg total) by mouth 3 (three) times daily. As needed for muscle spasm 05/25/19   Teryl Lucy, MD  Brinzolamide-Brimonidine 1-0.2 % SUSP Place 1 drop into both eyes daily.    [provider]  Cinnamon 500 MG capsule Take 500 mg by mouth daily.     [provider]  HYDROcodone-acetaminophen (NORCO) 10-325 MG tablet Take 1 tablet by mouth every 6 (six) hours as needed. 05/25/19   Teryl Lucy, MD  latanoprost (XALATAN) 0.005 % ophthalmic solution Place 1 drop into both eyes at bedtime.    [provider]  lisinopril (ZESTRIL) 5 MG tablet Take 5 mg by mouth daily.    [provider]  Multiple Vitamin (MULTIVITAMIN WITH MINERALS) TABS tablet Take 1 tablet by mouth daily. 06/02/18   Meccariello, Solmon Ice, DO  ondansetron (ZOFRAN) 4 MG tablet Take 1 tablet (4 mg total) by mouth every 8 (eight) hours as needed for nausea or vomiting. 05/25/19   Teryl Lucy, MD  pravastatin (PRAVACHOL) 10 MG tablet Take 10 mg by mouth at bedtime.    [provider]  rivaroxaban (XARELTO) 10 MG TABS tablet Take 1 tablet (10 mg total) by mouth daily. 05/25/19   Teryl Lucy, MD  sennosides-docusate sodium (SENOKOT-S) 8.6-50 MG tablet Take 2 tablets by mouth daily. 05/25/19   Teryl Lucy, MD  vitamin B-12 (CYANOCOBALAMIN) 1000 MCG tablet Take 1,000 mcg by mouth daily.    [provider]    Allergies    Patient has no known allergies.  Review of Systems   Review of Systems  Constitutional:  Negative for diaphoresis.  Musculoskeletal:        See HPI>  Skin:  Positive for wound.  Neurological:  Negative for numbness.   Physical Exam Updated Vital Signs BP (!) 148/79 (BP Location: Left Arm)   Pulse (!) 58   Temp 97.8 F (36.6 C) (Oral)   Resp 16   SpO2 99%   Physical Exam Vitals and nursing note reviewed.  Constitutional:      Appearance: Normal appearance. He is well-developed.  Pulmonary:     Effort: Pulmonary effort is normal.  Musculoskeletal:        General: Normal range of motion.     Cervical back: Normal range of motion.  Skin:    General: Skin is warm and dry.     Comments: Avulsion type laceration distal pad of right thumb. No nail involvement. No active bleeding.   Neurological:      Mental Status: He is alert and oriented to person, place, and time.    ED Results / Procedures / Treatments   Labs (all labs ordered are listed, but only abnormal results are displayed) Labs Reviewed - No data to display  EKG None  Radiology No results found.  Procedures Procedures   Medications Ordered in ED Medications  Thrombi-Pad 3"X3" pad 1 each (1 each Topical Given 07/15/21 0136)  Tdap (BOOSTRIX) injection 0.5 mL (0.5 mLs Intramuscular Given 07/15/21 0136)    ED Course  I have reviewed the triage vital signs and the nursing notes.  Pertinent labs & imaging results that were available during my care of the patient were reviewed by me and considered in my medical decision making (see chart for details).    MDM Rules/Calculators/A&P                           Patient to ED with avulsion type laceration to right thumb without nail involvement. Not on blood thinners.   Thrombi Pad applied to wound, and wound bandaged. No further procedure required.  Wound care instructions discussed with wife.  Tetanus updated as he is ultimately unsure when the last shot was.   Final Clinical Impression(s) / ED Diagnoses Final diagnoses:  None   Right thumb wound  Rx / DC Orders ED Discharge Orders     None        Elpidio Anis, PA-C 07/15/21 0149    Zadie Rhine, MD 07/15/21 [phone]

## 2021-07-15 NOTE — Discharge Instructions (Addendum)
Leave the bandage on for 2 days, then remove and clean with soap and water before applying another bandage. Use the ThrombiPad IF there is more bleeding, otherwise, use nonstick gauze and wrap. A bandage will no longer be needed once the wound scabs over.

## 2021-07-20 NOTE — Progress Notes (Signed)
[facility] Summary Report / Loop Recorder 

## 2021-09-04 ENCOUNTER — Encounter: Payer: Self-pay | Admitting: Internal Medicine
